# Patient Record
Sex: Male | Born: 1970 | Race: White | Hispanic: No | Marital: Married | Smoking: Former smoker
Health system: Southern US, Community
[De-identification: ages and names within clinical notes are randomized; demographics above are authoritative.]

## PROBLEM LIST (undated history)

## (undated) DIAGNOSIS — E785 Hyperlipidemia, unspecified: Secondary | ICD-10-CM

## (undated) DIAGNOSIS — I4891 Unspecified atrial fibrillation: Secondary | ICD-10-CM

## (undated) DIAGNOSIS — J45909 Unspecified asthma, uncomplicated: Secondary | ICD-10-CM

## (undated) HISTORY — PX: ANKLE SURGERY: SHX546

## (undated) HISTORY — DX: Unspecified atrial fibrillation: I48.91

---

## 1999-03-02 ENCOUNTER — Emergency Department (HOSPITAL_COMMUNITY): Admission: EM | Admit: 1999-03-02 | Discharge: 1999-03-02 | Payer: Self-pay | Admitting: Emergency Medicine

## 1999-03-02 ENCOUNTER — Encounter: Payer: Self-pay | Admitting: Emergency Medicine

## 2007-03-28 ENCOUNTER — Emergency Department (HOSPITAL_COMMUNITY): Admission: EM | Admit: 2007-03-28 | Discharge: 2007-03-28 | Payer: Self-pay | Admitting: Emergency Medicine

## 2011-01-11 LAB — TYPE AND SCREEN
ABO/RH(D): AB POS
Antibody Screen: NEGATIVE

## 2011-01-11 LAB — I-STAT 8, (EC8 V) (CONVERTED LAB)
Acid-Base Excess: 1
BUN: 10
Bicarbonate: 21.9
Chloride: 106
Glucose, Bld: 78
HCT: 48
Hemoglobin: 16.3
Operator id: 294521
Potassium: 3.9
Sodium: 138
TCO2: 23
pCO2, Ven: 25.3 — ABNORMAL LOW
pH, Ven: 7.545 — ABNORMAL HIGH

## 2011-01-11 LAB — ABO/RH: ABO/RH(D): AB POS

## 2011-01-11 LAB — POCT I-STAT CREATININE
Creatinine, Ser: 1.2
Operator id: 294521

## 2014-04-22 ENCOUNTER — Encounter (HOSPITAL_COMMUNITY): Payer: Self-pay | Admitting: Emergency Medicine

## 2014-04-22 ENCOUNTER — Emergency Department (HOSPITAL_COMMUNITY)
Admission: EM | Admit: 2014-04-22 | Discharge: 2014-04-22 | Disposition: A | Discharge status: Home / Self Care | Payer: BC Managed Care – PPO | Attending: Emergency Medicine | Admitting: Emergency Medicine

## 2014-04-22 ENCOUNTER — Emergency Department (HOSPITAL_COMMUNITY): Payer: BC Managed Care – PPO

## 2014-04-22 DIAGNOSIS — R0602 Shortness of breath: Secondary | ICD-10-CM | POA: Insufficient documentation

## 2014-04-22 DIAGNOSIS — J45909 Unspecified asthma, uncomplicated: Secondary | ICD-10-CM | POA: Diagnosis not present

## 2014-04-22 DIAGNOSIS — I4891 Unspecified atrial fibrillation: Secondary | ICD-10-CM

## 2014-04-22 DIAGNOSIS — Z720 Tobacco use: Secondary | ICD-10-CM | POA: Diagnosis not present

## 2014-04-22 DIAGNOSIS — I480 Paroxysmal atrial fibrillation: Secondary | ICD-10-CM

## 2014-04-22 HISTORY — DX: Unspecified asthma, uncomplicated: J45.909

## 2014-04-22 HISTORY — DX: Hyperlipidemia, unspecified: E78.5

## 2014-04-22 LAB — BASIC METABOLIC PANEL
Anion gap: 11 (ref 5–15)
BUN: 10 mg/dL (ref 6–23)
CHLORIDE: 107 meq/L (ref 96–112)
CO2: 21 mmol/L (ref 19–32)
CREATININE: 1.19 mg/dL (ref 0.50–1.35)
Calcium: 9.5 mg/dL (ref 8.4–10.5)
GFR calc Af Amer: 85 mL/min — ABNORMAL LOW (ref >90)
GFR calc non Af Amer: 73 mL/min — ABNORMAL LOW (ref >90)
GLUCOSE: 110 mg/dL — AB (ref 70–99)
POTASSIUM: 3.4 mmol/L — AB (ref 3.5–5.1)
SODIUM: 139 mmol/L (ref 135–145)

## 2014-04-22 LAB — MAGNESIUM: MAGNESIUM: 2 mg/dL (ref 1.5–2.5)

## 2014-04-22 LAB — CBC WITH DIFFERENTIAL/PLATELET
BASOS ABS: 0 10*3/uL (ref 0.0–0.1)
Basophils Relative: 0 % (ref 0–1)
EOS PCT: 0 % (ref 0–5)
Eosinophils Absolute: 0 10*3/uL (ref 0.0–0.7)
HEMATOCRIT: 44.8 % (ref 39.0–52.0)
Hemoglobin: 16 g/dL (ref 13.0–17.0)
LYMPHS PCT: 23 % (ref 12–46)
Lymphs Abs: 1.9 10*3/uL (ref 0.7–4.0)
MCH: 30.7 pg (ref 26.0–34.0)
MCHC: 35.7 g/dL (ref 30.0–36.0)
MCV: 86 fL (ref 78.0–100.0)
MONO ABS: 0.7 10*3/uL (ref 0.1–1.0)
Monocytes Relative: 9 % (ref 3–12)
NEUTROS ABS: 5.5 10*3/uL (ref 1.7–7.7)
Neutrophils Relative %: 68 % (ref 43–77)
PLATELETS: 189 10*3/uL (ref 150–400)
RBC: 5.21 MIL/uL (ref 4.22–5.81)
RDW: 12.9 % (ref 11.5–15.5)
WBC: 8.1 10*3/uL (ref 4.0–10.5)

## 2014-04-22 LAB — RAPID URINE DRUG SCREEN, HOSP PERFORMED
AMPHETAMINES: NOT DETECTED
BARBITURATES: NOT DETECTED
Benzodiazepines: NOT DETECTED
COCAINE: NOT DETECTED
OPIATES: NOT DETECTED
Tetrahydrocannabinol: NOT DETECTED

## 2014-04-22 LAB — TSH: TSH: 0.992 u[IU]/mL (ref 0.350–4.500)

## 2014-04-22 LAB — I-STAT TROPONIN, ED: TROPONIN I, POC: 0.01 ng/mL (ref 0.00–0.08)

## 2014-04-22 LAB — ETHANOL

## 2014-04-22 LAB — BRAIN NATRIURETIC PEPTIDE: B Natriuretic Peptide: 11 pg/mL (ref 0.0–100.0)

## 2014-04-22 MED ORDER — DILTIAZEM HCL 30 MG PO TABS
30.0000 mg | ORAL_TABLET | Freq: Once | ORAL | Status: AC
  Administered 2014-04-22: 30 mg via ORAL
  Filled 2014-04-22: qty 1

## 2014-04-22 MED ORDER — ASPIRIN EC 325 MG PO TBEC: 325.0000 mg | DELAYED_RELEASE_TABLET | Freq: Every day | ORAL | Status: DC

## 2014-04-22 MED ORDER — SODIUM CHLORIDE 0.9 % IV BOLUS (SEPSIS)
1000.0000 mL | Freq: Once | INTRAVENOUS | Status: AC
  Administered 2014-04-22: 1000 mL via INTRAVENOUS

## 2014-04-22 MED ORDER — FLECAINIDE ACETATE 100 MG PO TABS
300.0000 mg | ORAL_TABLET | Freq: Once | ORAL | Status: AC
  Administered 2014-04-22: 300 mg via ORAL
  Filled 2014-04-22 (×2): qty 3

## 2014-04-22 MED ORDER — POTASSIUM CHLORIDE CRYS ER 20 MEQ PO TBCR
40.0000 meq | EXTENDED_RELEASE_TABLET | Freq: Once | ORAL | Status: AC
  Administered 2014-04-22: 40 meq via ORAL
  Filled 2014-04-22: qty 2

## 2014-04-22 MED ORDER — DILTIAZEM HCL 100 MG IV SOLR
5.0000 mg/h | Freq: Once | INTRAVENOUS | Status: AC
  Administered 2014-04-22: 5 mg/h via INTRAVENOUS

## 2014-04-22 MED ORDER — MAGNESIUM SULFATE 2 GM/50ML IV SOLN
2.0000 g | Freq: Once | INTRAVENOUS | Status: AC
  Administered 2014-04-22: 2 g via INTRAVENOUS
  Filled 2014-04-22: qty 50

## 2014-04-22 MED ORDER — METOPROLOL SUCCINATE ER 25 MG PO TB24: 25.0000 mg | ORAL_TABLET | Freq: Every day | ORAL | Status: DC

## 2014-04-22 MED ORDER — ASPIRIN 81 MG PO CHEW
324.0000 mg | CHEWABLE_TABLET | Freq: Once | ORAL | Status: AC
  Administered 2014-04-22: 324 mg via ORAL
  Filled 2014-04-22: qty 4

## 2014-04-22 NOTE — Consult Note (Signed)
Primary cardiologist: New  HPI: 44 year old male with past medical history of asthma and hyperlipidemia for evaluation of atrial fibrillation. No prior cardiac history. He does not have dyspnea on exertion, orthopnea, PND, pedal edema, chest pain. He has a history of syncope when having his blood drawn. He has had occasional palpitations for years described as a brief flutter but not sustained. At approximately 4 AM today he awoke and went to the bathroom. Upon returning he developed sudden onset of palpitations described as his heart racing and fluttering. He had some dyspnea and dizziness but no syncope or chest pain. His symptoms continued and he presented to the emergency room and was found to be in atrial fibrillation. His heart rate has improved with Cardizem. Cardiology asked to evaluate. There is no history of diabetes mellitus, hypertension, congestive heart failure, prior stroke.   (Not in a hospital admission)  No Known Allergies   Past Medical History  Diagnosis Date  . Asthma   . Hyperlipidemia     Past Surgical History  Procedure Laterality Date  . Ankle surgery      History   Social History  . Marital Status: Married    Spouse Name: N/A    Number of Children: 2  . 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 Smoking status: Current Every Day Smoker    Types: Cigarettes  . Smokeless tobacco: Not on file  . Alcohol Use: No  . Drug Use: No  . Sexual Activity: Not on file   Other Topics Concern  . Not on file   Social History Narrative  . No narrative on file    Family History  Problem Relation Age of Onset  . Heart disease      No family history    ROS:  no fevers or chills, productive cough, hemoptysis, dysphasia, odynophagia, melena, hematochezia, dysuria, hematuria, rash, seizure activity, orthopnea, PND, pedal edema, claudication. Remaining systems are negative.  Physical Exam:   Blood  pressure 106/68, pulse 69, temperature 100.1 F (37.8 C), temperature source Oral, resp. rate 12, height '5\' 11"'  (1.803 m), weight 160 lb (72.576 kg), SpO2 97 %.  General:  Well developed/well nourished in NAD Skin warm/dry Patient not depressed No peripheral clubbing Back-normal HEENT-normal/normal eyelids Neck supple/normal carotid upstroke bilaterally; no bruits; no JVD; no thyromegaly chest - CTA/ normal expansion CV - irregular/normal S1 and S2; no murmurs, rubs or gallops;  PMI nondisplaced Abdomen -NT/ND, no HSM, no mass, + bowel sounds, no bruit 2+ femoral pulses, no bruits Ext-no edema, chords, 2+ DP Neuro-grossly nonfocal  ECG atrial fibrillation, no ST changes.   Results for orders placed or performed during the hospital encounter of 04/22/14 (from the past 48 hour(s))  Brain natriuretic peptide (only with dyspnea)     Status: None   Collection Time: 04/22/14  6:08 AM  Result Value Ref Range   B Natriuretic Peptide 11.0 0.0 - 100.0 pg/mL    Comment: Please note change in reference range.  CBC with Differential     Status: None   Collection Time: 04/22/14  6:08 AM  Result Value Ref Range   WBC 8.1 4.0 - 10.5 K/uL   RBC 5.21 4.22 - 5.81 MIL/uL   Hemoglobin 16.0 13.0 - 17.0 g/dL   HCT 44.8 39.0 - 52.0 %   MCV 86.0 78.0 - 100.0 fL   MCH 30.7 26.0 - 34.0 pg  MCHC 35.7 30.0 - 36.0 g/dL   RDW 12.9 11.5 - 15.5 %   Platelets 189 150 - 400 K/uL   Neutrophils Relative % 68 43 - 77 %   Neutro Abs 5.5 1.7 - 7.7 K/uL   Lymphocytes Relative 23 12 - 46 %   Lymphs Abs 1.9 0.7 - 4.0 K/uL   Monocytes Relative 9 3 - 12 %   Monocytes Absolute 0.7 0.1 - 1.0 K/uL   Eosinophils Relative 0 0 - 5 %   Eosinophils Absolute 0.0 0.0 - 0.7 K/uL   Basophils Relative 0 0 - 1 %   Basophils Absolute 0.0 0.0 - 0.1 K/uL  Basic metabolic panel     Status: Abnormal   Collection Time: 04/22/14  6:08 AM  Result Value Ref Range   Sodium 139 135 - 145 mmol/L    Comment: Please note change in  reference range.   Potassium 3.4 (L) 3.5 - 5.1 mmol/L    Comment: Please note change in reference range.   Chloride 107 96 - 112 mEq/L   CO2 21 19 - 32 mmol/L   Glucose, Bld 110 (H) 70 - 99 mg/dL   BUN 10 6 - 23 mg/dL   Creatinine, Ser 1.19 0.50 - 1.35 mg/dL   Calcium 9.5 8.4 - 10.5 mg/dL   GFR calc non Af Amer 73 (L) >90 mL/min   GFR calc Af Amer 85 (L) >90 mL/min    Comment: (NOTE) The eGFR has been calculated using the CKD EPI equation. This calculation has not been validated in all clinical situations. eGFR's persistently <90 mL/min signify possible Chronic Kidney Disease.    Anion gap 11 5 - 15  Magnesium     Status: None   Collection Time: 04/22/14  6:08 AM  Result Value Ref Range   Magnesium 2.0 1.5 - 2.5 mg/dL  Ethanol     Status: None   Collection Time: 04/22/14  6:08 AM  Result Value Ref Range   Alcohol, Ethyl (B) <5 0 - 9 mg/dL    Comment:        LOWEST DETECTABLE LIMIT FOR SERUM ALCOHOL IS 11 mg/dL FOR MEDICAL PURPOSES ONLY   Urine rapid drug screen (hosp performed)     Status: None   Collection Time: 04/22/14  6:14 AM  Result Value Ref Range   Opiates NONE DETECTED NONE DETECTED   Cocaine NONE DETECTED NONE DETECTED   Benzodiazepines NONE DETECTED NONE DETECTED   Amphetamines NONE DETECTED NONE DETECTED   Tetrahydrocannabinol NONE DETECTED NONE DETECTED   Barbiturates NONE DETECTED NONE DETECTED    Comment:        DRUG SCREEN FOR MEDICAL PURPOSES ONLY.  IF CONFIRMATION IS NEEDED FOR ANY PURPOSE, NOTIFY LAB WITHIN 5 DAYS.        LOWEST DETECTABLE LIMITS FOR URINE DRUG SCREEN Drug Class       Cutoff (ng/mL) Amphetamine      1000 Barbiturate      200 Benzodiazepine   109 Tricyclics       323 Opiates          300 Cocaine          300 THC              50   I-stat troponin, ED (not at [facility])     Status: None   Collection Time: 04/22/14  6:22 AM  Result Value Ref Range   Troponin i, poc 0.01 0.00 - 0.08 ng/mL   Comment 3  Comment: Due to the  release kinetics of cTnI, a negative result within the first hours of the onset of symptoms does not rule out myocardial infarction with certainty. If myocardial infarction is still suspected, repeat the test at appropriate intervals.     Dg Chest Port 1 View  04/22/2014   CLINICAL DATA:  Acute onset of shortness of breath. Atrial fibrillation. Initial encounter.  EXAM: PORTABLE CHEST - 1 VIEW  COMPARISON:  None.  FINDINGS: The lungs are well-aerated and clear. There is no evidence of focal opacification, pleural effusion or pneumothorax.  The cardiomediastinal silhouette is within normal limits. No acute osseous abnormalities are seen.  IMPRESSION: No acute cardiopulmonary process seen.   Electronically Signed   By: Garald Balding M.D.   On: 04/22/2014 06:26    Assessment/Plan 1 new-onset atrial fibrillation-the patient presents with palpitations that started at 4 AM today. His electrocardiogram is consistent with atrial fibrillation. He has no embolic risk factors including no diabetes mellitus, hypertension, prior stroke, coronary artery disease or congestive heart failure. His CHADSvasc -0. Given that his onset was less than 24 hours ago we will try to reestablish sinus rhythm. We will give flecainide 300 mg by mouth 1. Check TSH and echocardiogram. If LV function normal and he converts to sinus rhythm he can be discharged from a cardiac standpoint later today. At discharge I would treat with Toprol 25 mg daily and aspirin 325 mg daily. He will need follow-up with me. 2 tobacco abuse-patient counseled on discontinuing. 3 hypokalemia-supplement. For asthma-management per primary care.  Kirk Ruths MD 04/22/2014, 10:42 AM

## 2014-04-22 NOTE — ED Notes (Signed)
Ordered heart healthy breakfast tray for pt 

## 2014-04-22 NOTE — ED Provider Notes (Signed)
CSN: [id number]     Arrival date & time 04/22/14  0556 History   First MD Initiated Contact with Patient 04/22/14 0606     Chief Complaint  Patient presents with  . Atrial Fibrillation  . Shortness of Breath     (Consider location/radiation/quality/duration/timing/severity/associated sxs/prior Treatment) HPI  George Cordova is a 44 y.o. male with past medical history of asthma presenting today with palpitations and shortness of breath. Patient woke up at 445 this morning with the symptoms. He states he has had this before over the past couple years but his primary physician as they're available to catch it. He normally goes away within a few seconds however this time it persisted. EMS gave him a 20 mg bolus of diltiazem, followed by additional 10 mg bolus, followed by continuous drip at 5 mg per hour. Patient states he is symptomatically improved. He denies any risk factors for pulmonary embolism or history thereof. Patient has had tremendous anxiety and stress recently, he has also had a poor appetite. He denies any recent infections. Patient has no further complaints.  10 Systems reviewed and are negative for acute change except as noted in the HPI.     Past Medical History  Diagnosis Date  . Asthma    History reviewed. No pertinent past surgical history. No family history on file. History  Substance Use Topics  . Smoking status: Current Every Day Smoker    Types: Cigarettes  . Smokeless tobacco: Not on file  . Alcohol Use: No    Review of Systems    Allergies  Review of patient's allergies indicates no known allergies.  Home Medications   Prior to Admission medications   Not on File   BP 124/74 mmHg  Pulse 96  Temp(Src) 100.1 F (37.8 C) (Oral)  Resp 13  Ht  (1.803 m)  Wt 160 lb (72.576 kg)  BMI 22.33 kg/m2  SpO2 99% Physical Exam  Constitutional: He is oriented to person, place, and time. Vital signs are normal. He appears well-developed and  well-nourished.  Non-toxic appearance. He does not appear ill. No distress.  HENT:  Head: Normocephalic and atraumatic.  Nose: Nose normal.  Mouth/Throat: Oropharynx is clear and moist. No oropharyngeal exudate.  Eyes: Conjunctivae and EOM are normal. Pupils are equal, round, and reactive to light. No scleral icterus.  Neck: Normal range of motion. Neck supple. No tracheal deviation, no edema, no erythema and normal range of motion present. No thyroid mass and no thyromegaly present.  Cardiovascular: S1 normal, S2 normal, normal heart sounds, intact distal pulses and normal pulses.  Exam reveals no gallop and no friction rub.   No murmur heard. Pulses:      Radial pulses are 2+ on the right side, and 2+ on the left side.       Dorsalis pedis pulses are 2+ on the right side, and 2+ on the left side.  Irregularly irregular rhythm. Tachycardia.  Pulmonary/Chest: Effort normal and breath sounds normal. No respiratory distress. He has no wheezes. He has no rhonchi. He has no rales.  Abdominal: Soft. Normal appearance and bowel sounds are normal. He exhibits no distension, no ascites and no mass. There is no hepatosplenomegaly. There is no tenderness. There is no rebound, no guarding and no CVA tenderness.  Musculoskeletal: Normal range of motion. He exhibits no edema or tenderness.  Lymphadenopathy:    He has no cervical adenopathy.  Neurological: He is alert and oriented to person, place, and time. He has  normal strength. No cranial nerve deficit or sensory deficit. He exhibits normal muscle tone. GCS eye subscore is 4. GCS verbal subscore is 5. GCS motor subscore is 6.  Skin: Skin is warm, dry and intact. No petechiae and no rash noted. He is not diaphoretic. No erythema. No pallor.  Psychiatric: He has a normal mood and affect. His behavior is normal. Judgment normal.  Nursing note and vitals reviewed.   ED Course  Procedures (including critical care time) Labs Review Labs Reviewed  BASIC  METABOLIC PANEL - Abnormal; Notable for the following:    Potassium 3.4 (*)    Glucose, Bld 110 (*)    GFR calc non Af Amer 73 (*)    GFR calc Af Amer 85 (*)    All other components within normal limits  BRAIN NATRIURETIC PEPTIDE  CBC WITH DIFFERENTIAL  URINE RAPID DRUG SCREEN (HOSP PERFORMED)  MAGNESIUM  ETHANOL  TSH  I-STAT TROPOININ, ED    Imaging Review Dg Chest Port 1 View  04/22/2014   CLINICAL DATA:  Acute onset of shortness of breath. Atrial fibrillation. Initial encounter.  EXAM: PORTABLE CHEST - 1 VIEW  COMPARISON:  None.  FINDINGS: The lungs are well-aerated and clear. There is no evidence of focal opacification, pleural effusion or pneumothorax.  The cardiomediastinal silhouette is within normal limits. No acute osseous abnormalities are seen.  IMPRESSION: No acute cardiopulmonary process seen.   Electronically Signed   By: Roanna RaiderJeffery  Chang M.D.   On: 04/22/2014 06:26     EKG Interpretation   Date/Time:  Friday April 22 2014 06:03:32 EST Ventricular Rate:  115 PR Interval:    QRS Duration: 96 QT Interval:  333 QTC Calculation: 461 R Axis:   75 Text Interpretation:  Atrial fibrillation RSR' in V1 or V2, right VCD or  RVH Confirmed by Erroll Lunani, Rumi Taras Ayokunle [id number]) on 04/22/2014 6:12:08 AM      MDM   Final diagnoses:  None    Patient presents emergency department for palpitations and shortness of breath. EKG does reveal atrial fibrillation. On diltiazem drip patient's heart rate maintained between 100-110.At times, his HR does rise to 130s.  Patient will need admission to cardiology or hospitalist for continued management.  EDevaluation does not reveal a cause for his new onset a fib.  CHADS2VASC is zero.  Emergency department workup for etiology of atrial fibrillation is negative. Patient will be retained in the hospital for continued management.  CRITICAL CARE Performed by: Tomasita CrumbleNI,Kiko Ripp   Total critical care time: 40min  Critical care time was exclusive  of separately billable procedures and treating other patients.  Critical care was necessary to treat or prevent imminent or life-threatening deterioration.  Critical care was time spent personally by me on the following activities: development of treatment plan with patient and/or surrogate as well as nursing, discussions with consultants, evaluation of patient's response to treatment, examination of patient, obtaining history from patient or surrogate, ordering and performing treatments and interventions, ordering and review of laboratory studies, ordering and review of radiographic studies, pulse oximetry and re-evaluation of patient's condition.   Tomasita CrumbleAdeleke Ashelyn Mccravy, MD 04/22/14 1524

## 2014-04-22 NOTE — ED Notes (Signed)
Spoke with Dr. Rhunette CroftNanavati about cardizem drip. Instructed to stop drip at this time. Will continue to monitor HR.

## 2014-04-22 NOTE — ED Notes (Signed)
Went to check on patient. Noted to be in SR. HR 68. Pt reports he just started feeling better. EKG obtained.

## 2014-04-22 NOTE — Progress Notes (Signed)
  Echocardiogram 2D Echocardiogram has been performed.  George Cordova FRANCES 04/22/2014, 4:06 PM

## 2014-04-22 NOTE — ED Notes (Signed)
Contacted ECHO. Pt. Next on list to transport over to department.

## 2014-04-22 NOTE — ED Notes (Signed)
Pt reports he woke up from his sleep at 0445 feeling very SOB and that his heart was racing. Pt reports he has felt this sensation before but has never been seen for it as it would go away. EMS reports Afib RVR with a rate of 150-200 upon initial arrival. EMS adm a 20 mg bolus of cardizem, followed up with an additional 10 mg bolus of cardizem, with a continuous rate of 5mg /hr. Pt reports decreased SOB since administration of cardizem. Pt denies chest pain. A&O X4.

## 2014-04-22 NOTE — ED Notes (Signed)
MD at bedside. 

## 2014-04-22 NOTE — ED Provider Notes (Signed)
  Physical Exam  BP 119/76 mmHg  Pulse 83  Temp(Src) 100.1 F (37.8 C) (Oral)  Resp 17  Ht 5\' 11"  (1.803 m)  Wt 160 lb (72.576 kg)  BMI 22.33 kg/m2  SpO2 99%  Physical Exam  ED Course  Procedures  MDM  Pt came in with afib with RVR. He was started on diltiazem drip. Rate controlled over here. WE switched pt to oral dilt. Cards consulted. Pt received Flecainide, and had rest of his Cards workup done - all neg. Pt was discharged with toprol. He has converted to sinus. Return precautions discussed.  CRITICAL CARE Performed by: Derwood KaplanNanavati, Antonae Zbikowski   Total critical care time: 45 min  Critical care time was exclusive of separately billable procedures and treating other patients.  Critical care was necessary to treat or prevent imminent or life-threatening deterioration.  Critical care was time spent personally by me on the following activities: development of treatment plan with patient and/or surrogate as well as nursing, discussions with consultants, evaluation of patient's response to treatment, examination of patient, obtaining history from patient or surrogate, ordering and performing treatments and interventions, ordering and review of laboratory studies, ordering and review of radiographic studies, pulse oximetry and re-evaluation of patient's condition.    EKG Interpretation  Date/Time:  Friday April 22 2014 11:51:03 EST Ventricular Rate:  68 PR Interval:  173 QRS Duration: 94 QT Interval:  381 QTC Calculation: 405 R Axis:   68 Text Interpretation:  Sinus rhythm RSR' in V1 or V2, probably normal variant Converted to sinus, post flecainide Confirmed by Rhunette CroftNANAVATI, MD, Janey GentaANKIT [id number]) on 04/22/2014 12:09:59 PM        Derwood KaplanAnkit Tyde Lamison, MD 04/22/14 1700

## 2014-04-22 NOTE — Discharge Instructions (Signed)
You were noted to have atrial fibrillation. This is a new diagnosis. Cardiology saw you - and you had a full workkup whilst in the ER.  We would like you to be started on Toprol and Aspirin - everyday as prescribed. Return to the ER if the symptoms get worse.   See Cardiology doctor in 2 weeks.   Atrial Fibrillation Atrial fibrillation is a type of irregular heart rhythm (arrhythmia). During atrial fibrillation, the upper chambers of the heart (atria) quiver continuously in a chaotic pattern. This causes an irregular and often rapid heart rate.  Atrial fibrillation is the result of the heart becoming overloaded with disorganized signals that tell it to beat. These signals are normally released one at a time by a part of the right atrium called the sinoatrial node. They then travel from the atria to the lower chambers of the heart (ventricles), causing the atria and ventricles to contract and pump blood as they pass. In atrial fibrillation, parts of the atria outside of the sinoatrial node also release these signals. This results in two problems. First, the atria receive so many signals that they do not have time to fully contract. Second, the ventricles, which can only receive one signal at a time, beat irregularly and out of rhythm with the atria.  There are three types of atrial fibrillation:   Paroxysmal. Paroxysmal atrial fibrillation starts suddenly and stops on its own within a week.  Persistent. Persistent atrial fibrillation lasts for more than a week. It may stop on its own or with treatment.  Permanent. Permanent atrial fibrillation does not go away. Episodes of atrial fibrillation may lead to permanent atrial fibrillation. Atrial fibrillation can prevent your heart from pumping blood normally. It increases your risk of stroke and can lead to heart failure.  CAUSES   Heart conditions, including a heart attack, heart failure, coronary artery disease, and heart valve conditions.    Inflammation of the sac that surrounds the heart (pericarditis).  Blockage of an artery in the lungs (pulmonary embolism).  Pneumonia or other infections.  Chronic lung disease.  Thyroid problems, especially if the thyroid is overactive (hyperthyroidism).  Caffeine, excessive alcohol use, and use of some illegal drugs.   Use of some medicines, including certain decongestants and diet pills.  Heart surgery.   Birth defects.  Sometimes, no cause can be found. When this happens, the atrial fibrillation is called lone atrial fibrillation. The risk of complications from atrial fibrillation increases if you have lone atrial fibrillation and you are age 5 years or older. RISK FACTORS  Heart failure.  Coronary artery disease.  Diabetes mellitus.   High blood pressure (hypertension).   Obesity.   Other arrhythmias.   Increased age. SIGNS AND SYMPTOMS   A feeling that your heart is beating rapidly or irregularly.   A feeling of discomfort or pain in your chest.   Shortness of breath.   Sudden light-headedness or weakness.   Getting tired easily when exercising.   Urinating more often than normal (mainly when atrial fibrillation first begins).  In paroxysmal atrial fibrillation, symptoms may start and suddenly stop. DIAGNOSIS  Your health care provider may be able to detect atrial fibrillation when taking your pulse. Your health care provider may have you take a test called an ambulatory electrocardiogram (ECG). An ECG records your heartbeat patterns over a 24-hour period. You may also have other tests, such as:  Transthoracic echocardiogram (TTE). During echocardiography, sound waves are used to evaluate how blood flows through  your heart.  Transesophageal echocardiogram (TEE).  Stress test. There is more than one type of stress test. If a stress test is needed, ask your health care provider about which type is best for you.  Chest X-ray exam.  Blood  tests.  Computed tomography (CT). TREATMENT  Treatment may include:  Treating any underlying conditions. For example, if you have an overactive thyroid, treating the condition may correct atrial fibrillation.  Taking medicine. Medicines may be given to control a rapid heart rate or to prevent blood clots, heart failure, or a stroke.  Having a procedure to correct the rhythm of the heart:  Electrical cardioversion. During electrical cardioversion, a controlled, low-energy shock is delivered to the heart through your skin. If you have chest pain, very low blood pressure, or sudden heart failure, this procedure may need to be done as an emergency.  Catheter ablation. During this procedure, heart tissues that send the signals that cause atrial fibrillation are destroyed.  Surgical ablation. During this surgery, thin lines of heart tissue that carry the abnormal signals are destroyed. This procedure can either be an open-heart surgery or a minimally invasive surgery. With the minimally invasive surgery, small cuts are made to access the heart instead of a large opening.  Pulmonary venous isolation. During this surgery, tissue around the veins that carry blood from the lungs (pulmonary veins) is destroyed. This tissue is thought to carry the abnormal signals. HOME CARE INSTRUCTIONS   Take medicines only as directed by your health care provider. Some medicines can make atrial fibrillation worse or recur.  If blood thinners were prescribed by your health care provider, take them exactly as directed. Too much blood-thinning medicine can cause bleeding. If you take too little, you will not have the needed protection against stroke and other problems.  Perform blood tests at home if directed by your health care provider. Perform blood tests exactly as directed.  Quit smoking if you smoke.  Do not drink alcohol.  Do not drink caffeinated beverages such as coffee, soda, and some teas. You may drink  decaffeinated coffee, soda, or tea.   Maintain a healthy weight.Do not use diet pills unless your health care provider approves. They may make heart problems worse.   Follow diet instructions as directed by your health care provider.  Exercise regularly as directed by your health care provider.  Keep all follow-up visits as directed by your health care provider. This is important. PREVENTION  The following substances can cause atrial fibrillation to recur:   Caffeinated beverages.  Alcohol.  Certain medicines, especially those used for breathing problems.  Certain herbs and herbal medicines, such as those containing ephedra or ginseng.  Illegal drugs, such as cocaine and amphetamines. Sometimes medicines are given to prevent atrial fibrillation from recurring. Proper treatment of any underlying condition is also important in helping prevent recurrence.  SEEK MEDICAL CARE IF:  You notice a change in the rate, rhythm, or strength of your heartbeat.  You suddenly begin urinating more frequently.  You tire more easily when exerting yourself or exercising. SEEK IMMEDIATE MEDICAL CARE IF:   You have chest pain, abdominal pain, sweating, or weakness.  You feel nauseous.  You have shortness of breath.  You suddenly have swollen feet and ankles.  You feel dizzy.  Your face or limbs feel numb or weak.  You have a change in your vision or speech. MAKE SURE YOU:   Understand these instructions.  Will watch your condition.  Will get help  right away if you are not doing well or get worse. Document Released: 03/25/2005 Document Revised: 08/09/2013 Document Reviewed: 05/05/2012 [facility] Patient Information 2015 [place]. This information is not intended to replace advice given to you by your health care provider. Make sure you discuss any questions you have with your health care provider.

## 2014-05-25 ENCOUNTER — Telehealth: Payer: Self-pay | Admitting: *Deleted

## 2014-05-25 ENCOUNTER — Telehealth: Payer: Self-pay | Admitting: Cardiology

## 2014-05-25 MED ORDER — METOPROLOL SUCCINATE ER 25 MG PO TB24: 25.0000 mg | ORAL_TABLET | Freq: Every day | ORAL | Status: DC

## 2014-05-25 NOTE — Telephone Encounter (Signed)
Refill submitted to patient's preferred pharmacy. Informed patient. Pt voiced understanding, no other stated concerns at this time.  

## 2014-05-25 NOTE — Telephone Encounter (Signed)
°  1. Which medications need to be refilled? Toprolol--needs a new prescription sent   2. Which pharmacy is medication to be sent to?[facility] [address]  3. Do they need a 30 day or 90 day supply? 90  4. Would they like a call back once the medication has been sent to the pharmacy? yes

## 2014-05-25 NOTE — Progress Notes (Signed)
      HPI: 44 year old male for follow-up of atrial fibrillation. Seen in the emergency room in January 2016 with new onset atrial fibrillation. Converted to sinus rhythm with flecainide. Echocardiogram January 2016 showed normal LV function. TSH, hemoglobin and renal function normal. Potassium 3.4. Since last seen,  Current Outpatient Prescriptions  Medication Sig Dispense Refill  . aspirin EC 325 MG tablet Take 1 tablet (325 mg total) by mouth daily. 30 tablet 0  . Fish Oil-Cholecalciferol (FISH OIL + D3 PO) Take 1 capsule by mouth daily.    . metoprolol succinate (TOPROL-XL) 25 MG 24 hr tablet Take 1 tablet (25 mg total) by mouth daily. 30 tablet 0  . VITAMIN D, ERGOCALCIFEROL, PO Take 1 capsule by mouth daily.     No current facility-administered medications for this visit.     Past Medical History  Diagnosis Date  . Asthma   . Hyperlipidemia     Past Surgical History  Procedure Laterality Date  . Ankle surgery      History   Social History  . Marital Status: Married    Spouse Name: N/A  . Number of Children: 2  . 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 Smoking status: Current Every Day Smoker    Types: Cigarettes  . Smokeless tobacco: Not on file  . Alcohol Use: No  . Drug Use: No  . Sexual Activity: Not on file   Other Topics Concern  . Not on file   Social History Narrative  . No narrative on file    ROS: no fevers or chills, productive cough, hemoptysis, dysphasia, odynophagia, melena, hematochezia, dysuria, hematuria, rash, seizure activity, orthopnea, PND, pedal edema, claudication. Remaining systems are negative.  Physical Exam: Well-developed well-nourished in no acute distress.  Skin is warm and dry.  HEENT is normal.  Neck is supple.  Chest is clear to auscultation with normal expansion.  Cardiovascular exam is regular rate and rhythm.  Abdominal exam nontender or distended. No masses  palpated. Extremities show no edema. neuro grossly intact  ECG     This encounter was created in error - please disregard.

## 2014-05-26 NOTE — Telephone Encounter (Signed)
Med refilled.

## 2014-05-27 ENCOUNTER — Encounter: Payer: BC Managed Care – PPO | Admitting: Cardiology

## 2014-06-18 NOTE — Progress Notes (Signed)
      HPI: FU atrial fibrillation. Seen in ER 1/16 with atrial fibrillation. Patient converted to sinus with flecanide 300 mg po x 1. Echo 1/16 showed normal LV function. TSH, renal function and hemoglobin normal. Since then, the patient denies any dyspnea on exertion, orthopnea, PND, pedal edema, palpitations, syncope or chest pain.   Current Outpatient Prescriptions  Medication Sig Dispense Refill  . aspirin EC 325 MG tablet Take 1 tablet (325 mg total) by mouth daily. 30 tablet 0  . Fish Oil-Cholecalciferol (FISH OIL + D3 PO) Take 1 capsule by mouth daily.    . metoprolol succinate (TOPROL-XL) 25 MG 24 hr tablet Take 1 tablet (25 mg total) by mouth daily. 90 tablet 0  . VITAMIN D, ERGOCALCIFEROL, PO Take 1 capsule by mouth daily.     No current facility-administered medications for this visit.     Past Medical History  Diagnosis Date  . Asthma   . Hyperlipidemia   . Atrial fibrillation     Past Surgical History  Procedure Laterality Date  . Ankle surgery      History   Social History  . Marital Status: Married    Spouse Name: N/A  . Number of Children: 2  . 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 Smoking status: Current Every Day Smoker    Types: Cigarettes  . Smokeless tobacco: Not on file  . Alcohol Use: No  . Drug Use: No  . Sexual Activity: Not on file   Other Topics Concern  . Not on file   Social History Narrative    ROS: no fevers or chills, productive cough, hemoptysis, dysphasia, odynophagia, melena, hematochezia, dysuria, hematuria, rash, seizure activity, orthopnea, PND, pedal edema, claudication. Remaining systems are negative.  Physical Exam: Well-developed well-nourished in no acute distress.  Skin is warm and dry.  HEENT is normal.  Neck is supple.  Chest is clear to auscultation with normal expansion.  Cardiovascular exam is regular rate and rhythm.  Abdominal exam nontender or  distended. No masses palpated. Extremities show no edema. neuro grossly intact  ECG  sinus rhythm at a rate of 78. RV conduction delay.

## 2014-06-21 ENCOUNTER — Encounter: Payer: Self-pay | Admitting: Cardiology

## 2014-06-21 ENCOUNTER — Ambulatory Visit (INDEPENDENT_AMBULATORY_CARE_PROVIDER_SITE_OTHER): Payer: BC Managed Care – PPO | Admitting: Cardiology

## 2014-06-21 VITALS — BP 126/64 | HR 78 | Ht 71.0 in | Wt 148.0 lb

## 2014-06-21 DIAGNOSIS — I480 Paroxysmal atrial fibrillation: Secondary | ICD-10-CM

## 2014-06-21 DIAGNOSIS — Z720 Tobacco use: Secondary | ICD-10-CM

## 2014-06-21 NOTE — Patient Instructions (Signed)
Your physician wants you to follow-up in: 6 MONTHS WITH DR CRENSHAW You will receive a reminder letter in the mail two months in advance. If you don't receive a letter, please call our office to schedule the follow-up appointment.  

## 2014-06-21 NOTE — Assessment & Plan Note (Signed)
Patient remains in sinus rhythm.continue Toprol. We will consider an antiarrhythmic such as flecainide in the future if he has more frequent episodes. His Chadsvasc is 0. No anticoagulation indicated.

## 2014-06-21 NOTE — Assessment & Plan Note (Signed)
Patient counseled on discontinuing. 

## 2014-08-29 ENCOUNTER — Other Ambulatory Visit: Payer: Self-pay | Admitting: *Deleted

## 2014-08-29 MED ORDER — METOPROLOL SUCCINATE ER 25 MG PO TB24: 25.0000 mg | ORAL_TABLET | Freq: Every day | ORAL | Status: DC

## 2014-11-28 ENCOUNTER — Other Ambulatory Visit: Payer: Self-pay

## 2014-11-28 ENCOUNTER — Telehealth: Payer: Self-pay | Admitting: Cardiology

## 2014-11-28 MED ORDER — METOPROLOL SUCCINATE ER 25 MG PO TB24: 25.0000 mg | ORAL_TABLET | Freq: Every day | ORAL | Status: DC

## 2014-11-28 NOTE — Telephone Encounter (Signed)
°  1. Which medications need to be refilled? Metoprolol  2. Which pharmacy is medication to [facility] sent to?[phone]  3. Do they need a 30 day or 90 day supply? 90 and refills  4. Would they like a call back once the medication has been sent to the pharmacy? yes

## 2015-10-29 IMAGING — CR DG CHEST 1V PORT
1 series · 1 of 1 positions shown · non-contrast
Comparison: None.

CLINICAL DATA: Acute onset of shortness of breath. Atrial
fibrillation. Initial encounter.

EXAM:
PORTABLE CHEST - 1 VIEW

[AP]
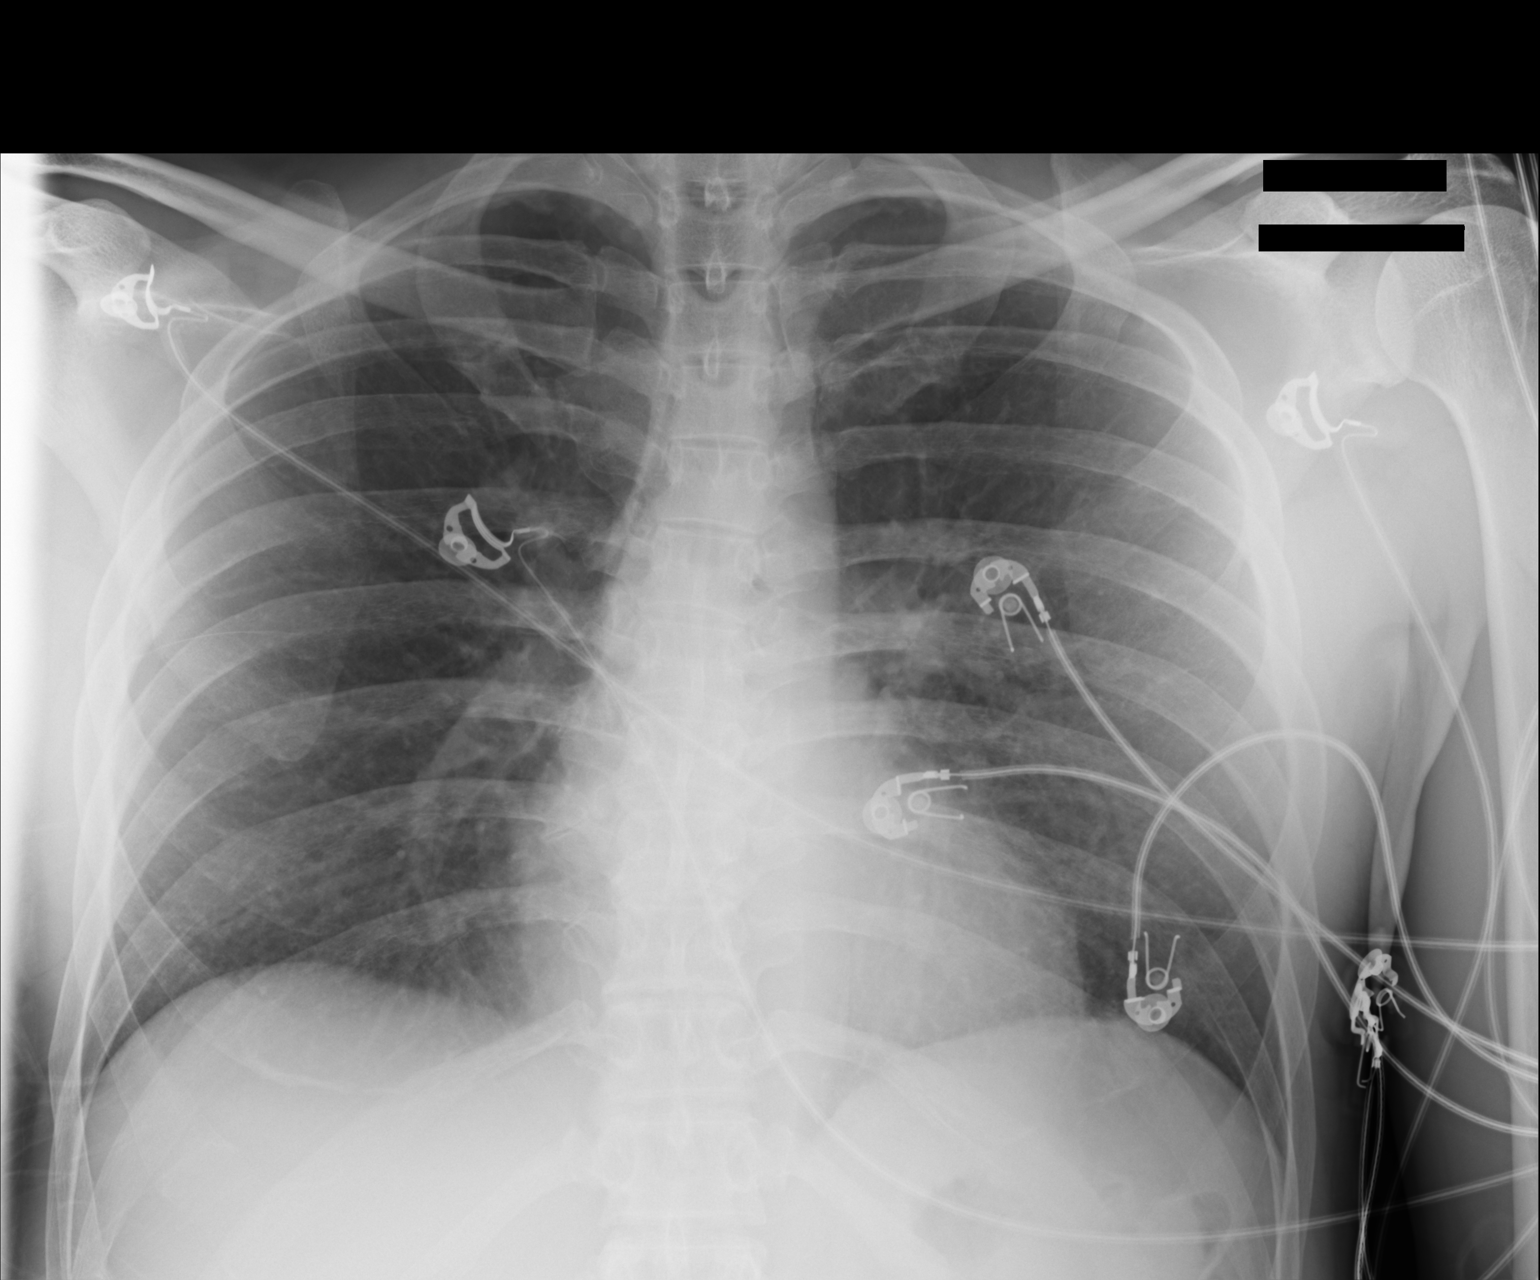

[1 of 1 positions shown; findings below may reference images not displayed]

FINDINGS: The lungs are well-aerated and clear. There is no evidence of focal
opacification, pleural effusion or pneumothorax.

The cardiomediastinal silhouette is within normal limits. No acute
osseous abnormalities are seen.
IMPRESSION: No acute cardiopulmonary process seen.

## 2015-11-10 ENCOUNTER — Telehealth: Payer: Self-pay | Admitting: *Deleted

## 2015-11-10 MED ORDER — METOPROLOL SUCCINATE ER 25 MG PO TB24: 25.0000 mg | ORAL_TABLET | Freq: Every day | ORAL | 0 refills | Status: DC

## 2015-11-10 NOTE — Telephone Encounter (Signed)
Patient calling to refill Metoprolol XL 25mg .

## 2016-02-08 ENCOUNTER — Other Ambulatory Visit: Payer: Self-pay | Admitting: *Deleted

## 2016-02-08 MED ORDER — METOPROLOL SUCCINATE ER 25 MG PO TB24: 25.0000 mg | ORAL_TABLET | Freq: Every day | ORAL | 0 refills | Status: DC

## 2016-02-09 ENCOUNTER — Telehealth: Payer: Self-pay | Admitting: Cardiology

## 2016-02-09 NOTE — Telephone Encounter (Signed)
Closed encounter °

## 2016-03-11 ENCOUNTER — Other Ambulatory Visit: Payer: Self-pay | Admitting: *Deleted

## 2016-03-11 MED ORDER — METOPROLOL SUCCINATE ER 25 MG PO TB24: 25.0000 mg | ORAL_TABLET | Freq: Every day | ORAL | 0 refills | Status: DC

## 2016-03-11 NOTE — Telephone Encounter (Signed)
Rx(s) sent to pharmacy electronically.  

## 2016-04-10 NOTE — Progress Notes (Signed)
      HPI: FU atrial fibrillation. Seen in ER 1/16 with atrial fibrillation. Patient converted to sinus with flecanide 300 mg po x 1. Echo 1/16 showed normal LV function. TSH, renal function and hemoglobin normal. Since last seen, the patient denies any dyspnea on exertion, orthopnea, PND, pedal edema, palpitations, syncope or chest pain.   Current Outpatient Prescriptions  Medication Sig Dispense Refill  . Ascorbic Acid (VITAMIN C PO) Take 1 tablet by mouth daily.    Marland Kitchen. aspirin EC 325 MG tablet Take 1 tablet (325 mg total) by mouth daily. 30 tablet 0  . Cholecalciferol (VITAMIN D PO) Take 1 tablet by mouth daily.    . Fish Oil-Cholecalciferol (FISH OIL + D3 PO) Take 1 capsule by mouth daily.    . metoprolol succinate (TOPROL-XL) 25 MG 24 hr tablet Take 1 tablet (25 mg total) by mouth daily. Must keep appointment 04/16/16 with Dr Jens Somrenshaw for future refills. 30 tablet 0  . VITAMIN D, ERGOCALCIFEROL, PO Take 1 capsule by mouth daily.     No current facility-administered medications for this visit.      Past Medical History:  Diagnosis Date  . Asthma   . Atrial fibrillation (HCC)   . Hyperlipidemia     Past Surgical History:  Procedure Laterality Date  . ANKLE SURGERY      Social History   Social History  . Marital status: Married    Spouse name: N/A  . Number of children: 2  . 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 Smoking status: Current Every Day Smoker    Types: Cigarettes  . Smokeless tobacco: Never Used  . Alcohol use No  . Drug use: No  . Sexual activity: Not on file   Other Topics Concern  . Not on file   Social History Narrative  . No narrative on file    Family History  Problem Relation Age of Onset  . Heart disease      No family history    ROS: no fevers or chills, productive cough, hemoptysis, dysphasia, odynophagia, melena, hematochezia, dysuria, hematuria, rash, seizure activity,  orthopnea, PND, pedal edema, claudication. Remaining systems are negative.  Physical Exam: Well-developed well-nourished in no acute distress.  Skin is warm and dry.  HEENT is normal.  Neck is supple. No bruits Chest is clear to auscultation with normal expansion.  Cardiovascular exam is regular rate and rhythm. No murmurs Abdominal exam nontender or distended. No masses palpated. Extremities show no edema. neuro grossly intact  ECG-Sinus rhythm at a rate of 61. No ST changes.  A/P  1 . Paroxysmal atrial fibrillation-patient remains in sinus rhythm. Continue Toprol. If he has more frequent episodes in the future we could consider an antiarrhythmic. CHADSvasc 0. No anticoagulation indicated. Continue ASA.  2 tobacco abuse-patient counseled on discontinuing.    Olga MillersBrian Crenshaw, MD

## 2016-04-11 ENCOUNTER — Other Ambulatory Visit: Payer: Self-pay | Admitting: Cardiology

## 2016-04-11 MED ORDER — METOPROLOL SUCCINATE ER 25 MG PO TB24: 25.0000 mg | ORAL_TABLET | Freq: Every day | ORAL | 0 refills | Status: DC

## 2016-04-11 NOTE — Telephone Encounter (Signed)
Rx(s) sent to pharmacy electronically.  

## 2016-04-11 NOTE — Telephone Encounter (Signed)
°*  STAT* If patient is at the pharmacy, call can be transferred to refill team.   1. Which medications need to be refilled? (please list name of each medication and dose if known) Need Metoprolol,out of it.Pt has an appointment on 04-16-16  2. Which pharmacy/location (including street and city if local pharmacy) is medication to be sent to?[facility] [phone]  3. Do they need a 30 day or 90 day supply? 30 if possible if not 6

## 2016-04-16 ENCOUNTER — Ambulatory Visit (INDEPENDENT_AMBULATORY_CARE_PROVIDER_SITE_OTHER): Payer: BC Managed Care – PPO | Admitting: Cardiology

## 2016-04-16 ENCOUNTER — Encounter: Payer: Self-pay | Admitting: Cardiology

## 2016-04-16 VITALS — BP 120/68 | HR 61 | Ht 71.0 in | Wt 160.0 lb

## 2016-04-16 DIAGNOSIS — I4891 Unspecified atrial fibrillation: Secondary | ICD-10-CM

## 2016-04-16 MED ORDER — METOPROLOL SUCCINATE ER 25 MG PO TB24: 25.0000 mg | ORAL_TABLET | Freq: Every day | ORAL | 3 refills | Status: DC

## 2016-04-16 NOTE — Patient Instructions (Signed)
Your physician wants you to follow-up in: ONE YEAR WITH DR CRENSHAW You will receive a reminder letter in the mail two months in advance. If you don't receive a letter, please call our office to schedule the follow-up appointment.   If you need a refill on your cardiac medications before your next appointment, please call your pharmacy.  

## 2016-05-13 ENCOUNTER — Other Ambulatory Visit: Payer: Self-pay | Admitting: Cardiology

## 2016-05-13 NOTE — Telephone Encounter (Signed)
metoprolol succinate (TOPROL-XL) 25 MG 24 hr tablet 90 tablet 3 04/16/2016    Sig - Route: Take 1 tablet (25 mg total) by mouth daily. - Oral   E-Prescribing Status: Receipt confirmed by pharmacy (04/16/2016 5:15 PM EST)   Associated Diagnoses   Atrial fibrillation, unspecified type [facility](HCC) - Primary     [id number] [place] [place] - [address]

## 2016-05-13 NOTE — Telephone Encounter (Signed)
°*  STAT* If patient is at the pharmacy, call can be transferred to refill team.   1. Which medications need to be refilled? (please list name of each medication and dose if known)Metoprolol .Marland Kitchen. Needs a Prescription Renewal   2. Which pharmacy/location (including street and city if local pharmacy) is medication to be sent to? [facility] [address]   3. Do they need a 30 day or 90 day supply? 90

## 2017-05-19 ENCOUNTER — Telehealth: Payer: Self-pay | Admitting: Cardiology

## 2017-05-19 DIAGNOSIS — I4891 Unspecified atrial fibrillation: Secondary | ICD-10-CM

## 2017-05-19 MED ORDER — METOPROLOL SUCCINATE ER 25 MG PO TB24: 25.0000 mg | ORAL_TABLET | Freq: Every day | ORAL | 0 refills | Status: DC

## 2017-05-19 NOTE — Telephone Encounter (Signed)
New Message     *STAT* If patient is at the pharmacy, call can be transferred to refill team.   1. Which medications need to be refilled? (please list name of each medication and dose if known) metoprolol succinate (TOPROL-XL) 25 MG 24 hr tablet  2. Which pharmacy/location (including street and city if local pharmacy) is medication to be sent to? [facility] [address]   3. Do they need a 30 day or 90 day supply? 90 day

## 2017-07-27 NOTE — Progress Notes (Signed)
Cardiology Office Note   Date:  07/28/2017   ID:  George Cordova, DOB 02/23/1971, MRN [id number]  PCP:  Jarome MatinPaterson, Daniel, MD  Cardiologist:  Jens Somrenshaw Chief Complaint  Patient presents with  . Follow-up    pt states feels flutter more with stress, denies SOB, swelling, chest pains  . Atrial Fibrillation    Paroxysmal     History of Present Illness: George Cordova is a 47 y.o. male who presents for ongoing assessment and management of atrial fib, converted to NSR on flecanide, hyperlipidemia, tobacco abuse, and asthma.  Was last seen by Dr. Jens Somrenshaw on 04/16/2016. At that time he remained in NSR and continued on metoprolol.   He comes today for medication refills.  He states that his heart rate is been predominantly well controlled without any paroxysms with exception of 1 which occurred under a moment of extreme stress which he did not elaborate on.  The atrial fibrillation went away after about 10 minutes according to the patient report.  He has been medically compliant as offers no complaints of dyspnea, fatigue, dizziness, or chest pain.  Past Medical History:  Diagnosis Date  . Asthma   . Atrial fibrillation (HCC)   . Hyperlipidemia     Past Surgical History:  Procedure Laterality Date  . ANKLE SURGERY       Current Outpatient Medications  Medication Sig Dispense Refill  . aspirin EC 325 MG tablet Take 1 tablet (325 mg total) by mouth daily. 30 tablet 0  . metoprolol succinate (TOPROL-XL) 25 MG 24 hr tablet Take 1 tablet (25 mg total) by mouth daily. 90 tablet 3  . VITAMIN D, ERGOCALCIFEROL, PO Take 1 capsule by mouth daily.    . Ascorbic Acid (VITAMIN C PO) Take 1 tablet by mouth daily.    . Cholecalciferol (VITAMIN D PO) Take 1 tablet by mouth daily.    . Fish Oil-Cholecalciferol (FISH OIL + D3 PO) Take 1 capsule by mouth daily.     No current facility-administered medications for this visit.     Allergies:   Patient has no known allergies.    Social History:  The  patient  reports that he has been smoking cigarettes.  He has never used smokeless tobacco. He reports that he does not drink alcohol or use drugs.   Family History:  The patient's family history includes Heart disease in his unknown relative.    ROS: All other systems are reviewed and negative. Unless otherwise mentioned in H&P    PHYSICAL EXAM: VS:  BP 120/78   Pulse 83   Wt 165 lb (74.8 kg)   BMI 23.01 kg/m  , BMI Body mass index is 23.01 kg/m. GEN: Well nourished, well developed, in no acute distress  HEENT: normal  Neck: no JVD, carotid bruits, or masses Cardiac: RRR; no murmurs, rubs, or gallops,no edema  Respiratory:  Clear to auscultation bilaterally, normal work of breathing GI: soft, nontender, nondistended, + BS MS: no deformity or atrophy  Skin: warm and dry, no rash Neuro:  Strength and sensation are intact Psych: euthymic mood, full affect   EKG: Normal sinus rhythm heart rate of 83 bpm  Recent Labs: No results found for requested labs within last 8760 hours.    Lipid Panel No results found for: CHOL, TRIG, HDL, CHOLHDL, VLDL, LDLCALC, LDLDIRECT    Wt Readings from Last 3 Encounters:  07/28/17 165 lb (74.8 kg)  04/16/16 160 lb (72.6 kg)  06/21/14 148 lb (67.1 kg)  Other studies Reviewed: Echocardiogram 27-Apr-2014.  Left ventricle: The cavity size was normal. Wall thickness was normal. Systolic function was normal. The estimated ejection fraction was in the range of 50% to 55%. Wall motion was normal; there were no regional wall motion abnormalities. Left ventricular diastolic function parameters were normal.  ASSESSMENT AND PLAN:  1.  Paroxysmal atrial fibrillation: The patient reports that his heart rate is been essentially normal except for one episode when he did feel rapid irregular heart rate during a time of extreme stress which he did not elaborate on.  He has had no further paroxysms since that time.  He is medically compliant.   CHADS VASC 0.  He will continue current medication regimen with refills on his metoprolol.  2.  Hypercholesterolemia: This is followed by his PCP Dr. Jarold Motto.  He is due to have an appointment with him within the next few months.  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  I have asked him to send [place] a copy of the latest labs once they are drawn.  He does not wish to have any labs drawn by our practice today.   Current medicines are reviewed at length with the patient today.    Labs/ tests ordered today include: None but did not make you come back tomorrow that your punishment by you and then make you,  Bettey Mare. Liborio Nixon, ANP, AACC   07/28/2017 4:43 PM    [facility] [address] [place], [place] [address] Phone: [phone]; Fax: [phone]

## 2017-07-28 ENCOUNTER — Ambulatory Visit: Payer: BC Managed Care – PPO | Admitting: Adult Health

## 2017-07-28 ENCOUNTER — Encounter: Payer: Self-pay | Admitting: Adult Health

## 2017-07-28 VITALS — BP 120/78 | HR 83 | Ht 72.0 in | Wt 165.0 lb

## 2017-07-28 DIAGNOSIS — I4891 Unspecified atrial fibrillation: Secondary | ICD-10-CM | POA: Diagnosis not present

## 2017-07-28 DIAGNOSIS — E7800 Pure hypercholesterolemia, unspecified: Secondary | ICD-10-CM | POA: Diagnosis not present

## 2017-07-28 MED ORDER — METOPROLOL SUCCINATE ER 25 MG PO TB24: 25.0000 mg | ORAL_TABLET | Freq: Every day | ORAL | 3 refills | Status: DC

## 2017-07-28 NOTE — Patient Instructions (Signed)
Medication Instructions:  NO CHANGES- Your physician recommends that you continue on your current medications as directed. Please refer to the Current Medication list given to you today.  If you need a refill on your cardiac medications before your next appointment, please call your pharmacy.  Follow-Up: Your physician wants you to follow-up in: 12 MONTHS WITH DR Jens SomRENSHAW You should receive a reminder letter in the mail two months in advance. If you do not receive a letter, please call our office [phone] to schedule the 05-2018 follow-up appointment.   Thank you [facility] choosing [facility]!!

## 2018-08-28 ENCOUNTER — Other Ambulatory Visit: Payer: Self-pay | Admitting: *Deleted

## 2018-08-28 DIAGNOSIS — I4891 Unspecified atrial fibrillation: Secondary | ICD-10-CM

## 2018-08-28 MED ORDER — METOPROLOL SUCCINATE ER 25 MG PO TB24: 25.0000 mg | ORAL_TABLET | Freq: Every day | ORAL | 1 refills | Status: DC

## 2018-12-07 NOTE — Progress Notes (Signed)
HPI: FU atrial fibrillation. Seen in ER 1/16 with atrial fibrillation. Patient converted to sinus with flecanide 300 mg po x 1. Echo 1/16 showed normal LV function. TSH, renal function and hemoglobin normal. Since last seen,  patient denies dyspnea, chest pain or syncope.  Occasional brief flutter but no sustained palpitations similar to his atrial fibrillation.  Current Outpatient Medications  Medication Sig Dispense Refill  . Ascorbic Acid (VITAMIN C PO) Take 1 tablet by mouth daily.    Marland Kitchen aspirin EC 325 MG tablet Take 1 tablet (325 mg total) by mouth daily. 30 tablet 0  . Cholecalciferol (VITAMIN D PO) Take 1 tablet by mouth daily.    . Fish Oil-Cholecalciferol (FISH OIL + D3 PO) Take 1 capsule by mouth daily.    . metoprolol succinate (TOPROL-XL) 25 MG 24 hr tablet Take 1 tablet (25 mg total) by mouth daily. 90 tablet 1  . VITAMIN D, ERGOCALCIFEROL, PO Take 1 capsule by mouth daily.     No current facility-administered medications for this visit.      Past Medical History:  Diagnosis Date  . Asthma   . Atrial fibrillation ([place])   . Hyperlipidemia     Past Surgical History:  Procedure Laterality Date  . ANKLE SURGERY      Social History   Socioeconomic History  . Marital status: Married    Spouse name: Not on file  . Number of children: 2  . Years of education: Not on file  . Highest education level: Not on file  Occupational History    Comment: Teacher  Social Needs  . Financial resource strain: Not on file  . Food insecurity    Worry: Not on file    Inability: Not on file  . Transportation needs    Medical: Not on file    Non-medical: Not on file  Tobacco Use  . Smoking status: Current Every Day Smoker    Types: Cigarettes  . Smokeless tobacco: Never Used  Substance and Sexual Activity  . Alcohol use: No    Alcohol/week: 0.0 standard drinks  . Drug use: No  . Sexual activity: Not on file  Lifestyle  . Physical activity    Days per week: Not on  file    Minutes per session: Not on file  . Stress: Not on file  Relationships  . Social Herbalist on phone: Not on file    Gets together: Not on file    Attends religious service: Not on file    Active member of club or organization: Not on file    Attends meetings of clubs or organizations: Not on file    Relationship status: Not on file  . Intimate partner violence    Fear of current or ex partner: Not on file    Emotionally abused: Not on file    Physically abused: Not on file    Forced sexual activity: Not on file  Other Topics Concern  . Not on file  Social History Narrative  . Not on file    Family History  Problem Relation Age of Onset  . Heart disease Other        No family history    ROS: no fevers or chills, productive cough, hemoptysis, dysphasia, odynophagia, melena, hematochezia, dysuria, hematuria, rash, seizure activity, orthopnea, PND, pedal edema, claudication. Remaining systems are negative.  Physical Exam: Well-developed well-nourished in no acute distress.  Skin is warm and dry.  HEENT is  normal.  Neck is supple.  Chest is clear to auscultation with normal expansion.  Cardiovascular exam is regular rate and rhythm.  Abdominal exam nontender or distended. No masses palpated. Extremities show no edema. neuro grossly intact  ECG-sinus bradycardia, RV conduction delay.  Personally reviewed  A/P  1 paroxysmal atrial fibrillation-patient remains in sinus rhythm.  Continue Toprol at present dose.  If he has more frequent episodes in the future we will consider antiarrhythmic such as flecainide.  No anticoagulation given CHADSvasc 0.  Decrease aspirin to 81 mg daily.  2 tobacco abuse-patient counseled on discontinuing.  3 hyperlipidemia-managed by primary care.  Olga MillersBrian , MD

## 2018-12-08 ENCOUNTER — Ambulatory Visit: Payer: BC Managed Care – PPO | Admitting: Cardiology

## 2018-12-08 ENCOUNTER — Encounter: Payer: Self-pay | Admitting: Cardiology

## 2018-12-08 ENCOUNTER — Other Ambulatory Visit: Payer: Self-pay

## 2018-12-08 VITALS — BP 112/76 | HR 57 | Temp 97.5°F | Ht 70.0 in | Wt 167.0 lb

## 2018-12-08 DIAGNOSIS — I4891 Unspecified atrial fibrillation: Secondary | ICD-10-CM | POA: Diagnosis not present

## 2018-12-08 DIAGNOSIS — Z720 Tobacco use: Secondary | ICD-10-CM

## 2018-12-08 DIAGNOSIS — E7800 Pure hypercholesterolemia, unspecified: Secondary | ICD-10-CM | POA: Diagnosis not present

## 2018-12-08 MED ORDER — ASPIRIN 81 MG PO TBEC: 81.0000 mg | DELAYED_RELEASE_TABLET | Freq: Every day | ORAL | Status: AC

## 2018-12-08 NOTE — Patient Instructions (Signed)
Medication Instructions:  DECREASE ASPIRIN TO 81 MG ONCE DAILY If you need a refill on your cardiac medications before your next appointment, please call your pharmacy.   Lab work: If you have labs (blood work) drawn today and your tests are completely normal, you will receive your results only by: . MyChart Message (if you have MyChart) OR . A paper copy in the mail If you have any lab test that is abnormal or we need to change your treatment, we will call you to review the results.  Follow-Up: At [facility], you and your health needs are our priority.  As part of our continuing mission to provide you with exceptional heart care, we have created designated Provider Care Teams.  These Care Teams include your primary Cardiologist (physician) and Advanced Practice Providers (APPs -  Physician Assistants and Nurse Practitioners) who all work together to provide you with the care you need, when you need it. You will need a follow up appointment in 12 months.  Please call our office 2 months in advance to schedule this appointment.  You may see BRIAN CRENSHAW MD or one of the following Advanced Practice Providers on your designated Care Team:   Luke Kilroy, PA-C Krista Kroeger, PA-C . Callie Goodrich, PA-C     

## 2019-03-01 ENCOUNTER — Other Ambulatory Visit: Payer: Self-pay | Admitting: Cardiology

## 2019-03-01 DIAGNOSIS — I4891 Unspecified atrial fibrillation: Secondary | ICD-10-CM

## 2019-03-01 NOTE — Telephone Encounter (Signed)
*  STAT* If patient is at the pharmacy, call can be transferred to refill team.   1. Which medications need to be refilled? (please list name of each medication and dose if known) metoprolol succinate (TOPROL-XL) 25 MG 24 hr tablet   2. Which pharmacy/location (including street and city if local pharmacy) is medication to be sent to? [facility] - [place], [place] - [address]   3. Do they need a 30 day or 90 day supply? 90  

## 2019-03-02 MED ORDER — METOPROLOL SUCCINATE ER 25 MG PO TB24: 25.0000 mg | ORAL_TABLET | Freq: Every day | ORAL | 3 refills | Status: DC

## 2019-11-25 ENCOUNTER — Telehealth: Payer: Self-pay | Admitting: *Deleted

## 2019-11-25 NOTE — Telephone Encounter (Signed)
A message was left, re: his follow up visit. 

## 2020-02-17 ENCOUNTER — Telehealth: Payer: Self-pay | Admitting: Cardiology

## 2020-02-17 ENCOUNTER — Encounter: Payer: Self-pay | Admitting: General Practice

## 2020-02-17 NOTE — Telephone Encounter (Signed)
  Recall expunge letter sent for overdue 1 yr f/u

## 2020-02-28 NOTE — Progress Notes (Signed)
Cardiology Clinic Note   Patient Name: George Cordova Date of Encounter: 02/29/2020  Primary Care Provider:  Jarome Matin, MD Primary Cardiologist:  Olga Millers, MD  Patient Profile    George Cordova 49 year old male presents to the clinic today for follow-up evaluation of his atrial fibrillation.  Past Medical History    Past Medical History:  Diagnosis Date  . Asthma   . Atrial fibrillation ([facility])   . Hyperlipidemia    Past Surgical History:  Procedure Laterality Date  . ANKLE SURGERY      Allergies  No Known Allergies  History of Present Illness    George Cordova has a PMH of atrial fibrillation and tobacco abuse.  He was last seen by Dr. Jens Som on 12/08/2018.  He was initially seen in the emergency department 1/16 with atrial fibrillation.  He converted to sinus rhythm with flecainide 200 mg p.o. x1.  His echocardiogram 1/16 showed normal LVEF.  His TSH, renal function and hemoglobin were normal.  He denied dyspnea, chest pain, and syncope.  He did note an occasional short-lived/brief letter but denied sustained palpitations similar to his atrial fibrillation.  He presents the clinic today for follow-up evaluation and states he feels well.  He notices intermittent episodes of palpitations in the evening.  He describes these as brief and lasting for a few seconds.  He reports compliance with his metoprolol.  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  He reports dietary indiscretion and eating some convenience type foods.  He has not had labs drawn in quite some time.  We will repeat his CBC, BMP, and lipid panel today.  I will refill his metoprolol and have him follow-up in 12 months.  Today he denies chest pain, shortness of breath, lower extremity edema, fatigue, melena, hematuria, hemoptysis, diaphoresis, weakness, presyncope, syncope, orthopnea, and PND.   Home Medications    Prior to Admission medications   Medication  Sig Start Date End Date Taking? Authorizing Provider  Ascorbic Acid (VITAMIN C PO) Take 1 tablet by mouth daily.    [provider]  aspirin EC 81 MG EC tablet Take 1 tablet (81 mg total) by mouth daily. 12/08/18   Lewayne Bunting, MD  Cholecalciferol (VITAMIN D PO) Take 1 tablet by mouth daily.    [provider]  Fish Oil-Cholecalciferol (FISH OIL + D3 PO) Take 1 capsule by mouth daily.    [provider]  metoprolol succinate (TOPROL-XL) 25 MG 24 hr tablet Take 1 tablet (25 mg total) by mouth daily. 03/02/19   Lewayne Bunting, MD  VITAMIN D, ERGOCALCIFEROL, PO Take 1 capsule by mouth daily.    [provider]    Family History    Family History  Problem Relation Age of Onset  . Heart disease Other        No family history   He indicated that the status of his other is unknown.  Social History    Social History   Socioeconomic History  . Marital status: Married    Spouse name: Not on file  . Number of children: 2  . Years of education: Not on file  . Highest education level: Not on file  Occupational History    Comment: Teacher  Tobacco Use  . Smoking status: Current Every Day Smoker    Types: Cigarettes  . Smokeless tobacco: Never Used  Substance and Sexual Activity  . Alcohol use: No  Alcohol/week: 0.0 standard drinks  . Drug use: No  . Sexual activity: Not on file  Other Topics Concern  . Not on file  Social History Narrative  . Not on file   Social Determinants of Health   Financial Resource Strain:   . Difficulty of Paying Living Expenses: Not on file  Food Insecurity:   . Worried About Programme researcher, broadcasting/film/video in the Last Year: Not on file  . Ran Out of Food in the Last Year: Not on file  Transportation Needs:   . Lack of Transportation (Medical): Not on file  . Lack of Transportation (Non-Medical): Not on file  Physical Activity:   . Days of Exercise per Week: Not on file  . Minutes of Exercise per Session: Not on  file  Stress:   . Feeling of Stress : Not on file  Social Connections:   . Frequency of Communication with Friends and Family: Not on file  . Frequency of Social Gatherings with Friends and Family: Not on file  . Attends Religious Services: Not on file  . Active Member of Clubs or Organizations: Not on file  . Attends Banker Meetings: Not on file  . Marital Status: Not on file  Intimate Partner Violence:   . Fear of Current or Ex-Partner: Not on file  . Emotionally Abused: Not on file  . Physically Abused: Not on file  . Sexually Abused: Not on file     Review of Systems    General:  No chills, fever, night sweats or weight changes.  Cardiovascular:  No chest pain, dyspnea on exertion, edema, orthopnea, palpitations, paroxysmal nocturnal dyspnea. Dermatological: No rash, lesions/masses Respiratory: No cough, dyspnea Urologic: No hematuria, dysuria Abdominal:   No nausea, vomiting, diarrhea, bright red blood per rectum, melena, or hematemesis Neurologic:  No visual changes, wkns, changes in mental status. All other systems reviewed and are otherwise negative except as noted above.  Physical Exam    VS:  BP 124/72   Pulse 84   Ht 5\' 10"  (1.778 m)   Wt 171 lb 6.4 oz (77.7 kg)   BMI 24.59 kg/m  , BMI Body mass index is 24.59 kg/m. GEN: Well nourished, well developed, in no acute distress. HEENT: normal. Neck: Supple, no JVD, carotid bruits, or masses. Cardiac: RRR, no murmurs, rubs, or gallops. No clubbing, cyanosis, edema.  Radials/DP/PT 2+ and equal bilaterally.  Respiratory:  Respirations regular and unlabored, clear to auscultation bilaterally. GI: Soft, nontender, nondistended, BS + x 4. MS: no deformity or atrophy. Skin: warm and dry, no rash. Neuro:  Strength and sensation are intact. Psych: Normal affect.  Accessory Clinical Findings    Recent Labs: No results found for requested labs within last 8760 hours.   Recent Lipid Panel No results found  for: CHOL, TRIG, HDL, CHOLHDL, VLDL, LDLCALC, LDLDIRECT  ECG personally reviewed by me today-normal sinus rhythm 84 bpm no ST or T wave deviation- No acute changes  Echocardiogram 04/22/2014 Study Conclusions   - Left ventricle: The cavity size was normal. Wall thickness was  normal. Systolic function was normal. The estimated ejection  fraction was in the range of 50% to 55%. Wall motion was normal;  there were no regional wall motion abnormalities. Left  ventricular diastolic function parameters were normal.   Impressions:   - Normal study.    Assessment & Plan   1.  Paroxysmal atrial fibrillation-EKG today shows normal sinus rhythm 84 bpm no ST or T wave deviation.  No recent episodes of accelerated heart rate or skipped beats.  CHA2DS2-VASc score 0.  We reviewed chads vas scoring. Continue aspirin, metoprolol Avoid triggers caffeine, chocolate, EtOH etc. Heart healthy low-sodium diet-salty 6 given Increase physical activity as tolerated Order BMP, CBC, lipid panel  Hyperlipidemia-inactive and takes fish oil Heart healthy low-sodium high-fiber diet Increase physical activity as tolerated Order lipid panel  Disposition: Follow-up with Dr. Jens Som or me in 12 months.  Thomasene Ripple. Ophie Burrowes NP-C    02/29/2020, 2:01 PM [facility] [address] Office [phone] Fax [phone]  Notice: This dictation was prepared with Dragon dictation along with smaller phrase technology. Any transcriptional errors that result from this process are unintentional and may not be corrected upon review.

## 2020-02-29 ENCOUNTER — Other Ambulatory Visit: Payer: Self-pay

## 2020-02-29 ENCOUNTER — Encounter: Payer: Self-pay | Admitting: General Practice

## 2020-02-29 ENCOUNTER — Ambulatory Visit: Payer: BC Managed Care – PPO | Admitting: General Practice

## 2020-02-29 VITALS — BP 124/72 | HR 84 | Ht 70.0 in | Wt 171.4 lb

## 2020-02-29 DIAGNOSIS — I4891 Unspecified atrial fibrillation: Secondary | ICD-10-CM

## 2020-02-29 DIAGNOSIS — E7800 Pure hypercholesterolemia, unspecified: Secondary | ICD-10-CM | POA: Diagnosis not present

## 2020-02-29 DIAGNOSIS — Z79899 Other long term (current) drug therapy: Secondary | ICD-10-CM | POA: Diagnosis not present

## 2020-02-29 DIAGNOSIS — Z720 Tobacco use: Secondary | ICD-10-CM

## 2020-02-29 LAB — CBC: Platelets: 199 10*3/uL (ref 150–450)

## 2020-02-29 LAB — BASIC METABOLIC PANEL

## 2020-02-29 MED ORDER — METOPROLOL SUCCINATE ER 25 MG PO TB24: 25.0000 mg | ORAL_TABLET | Freq: Every day | ORAL | 3 refills | Status: DC

## 2020-02-29 NOTE — Patient Instructions (Addendum)
Medication Instructions:  The current medical regimen is effective;  continue present plan and medications as directed. Please refer to the Current Medication list given to you today. *If you need a refill on your cardiac medications before your next appointment, please call your pharmacy*  Lab Work: FASTING LIPID.CBC AND BMET-TODAY If you have labs (blood work) drawn today and your tests are completely normal, you will receive your results only by:  MyChart Message (if you have MyChart) OR A paper copy in the mail.  If you have any lab test that is abnormal or we need to change your treatment, we will call you to review the results. You may go to any [facility] that is convenient for you however, we do have a lab in our office that is able to assist you. You DO NOT need an appointment for our lab. The lab is open 8:00am and closes at 4:00pm. Lunch 12:45 - 1:45pm.  Special Instructions PLEASE READ AND FIBER DIET-ATTACHED  PLEASE READ AND FOLLOW SALTY 6-ATTACHED-1,800 mg daily  Please try to avoid these triggers:  Do not use any products that have nicotine or tobacco in them. These include cigarettes, e-cigarettes, and chewing tobacco. If you need help quitting, ask your doctor.  Eat heart-healthy foods. Talk with your doctor about the right eating plan for you.  Exercise regularly as told by your doctor.  Do not drink alcohol, Caffeine or chocolate.  Lose weight if you are overweight.  Do not use drugs, including cannabis     Follow-Up: Your next appointment:  12 month(s) In Person with You may see Olga Millers, MD or one of the following Advanced Practice Providers on your designated Care Team:  Corine Shelter, PA-C [place], [place] Edd Fabian, [place].    Please call our office 2 months in advance to schedule this appointment   At [facility], you and your health needs are our priority.  As part of our continuing mission to provide you with exceptional heart care, we have  created designated Provider Care Teams.  These Care Teams include your primary Cardiologist (physician) and Advanced Practice Providers (APPs -  Physician Assistants and Nurse Practitioners) who all work together to provide you with the care you need, when you need it.  We recommend signing up for the patient portal called "MyChart".  Sign up information is provided on this After Visit Summary.  MyChart is used to connect with patients for Virtual Visits (Telemedicine).  Patients are able to view lab/test results, encounter notes, upcoming appointments, etc.  Non-urgent messages can be sent to your provider as well.   To learn more about what you can do with MyChart, go to [email or web address].        High-Fiber Diet Fiber, also called dietary fiber, is a type of carbohydrate that is found in fruits, vegetables, whole grains, and beans. A high-fiber diet can have many health benefits. Your health care provider may recommend a high-fiber diet to help:  Prevent constipation. Fiber can make your bowel movements more regular.  Lower your cholesterol.  Relieve the following conditions: ? Swelling of veins in the anus (hemorrhoids). ? Swelling and irritation (inflammation) of specific areas of the digestive tract (uncomplicated diverticulosis). ? A problem of the large intestine (colon) that sometimes causes pain and diarrhea (irritable bowel syndrome, IBS).  Prevent overeating as part of a weight-loss plan.  Prevent heart disease, type 2 diabetes, and certain cancers. What is my plan? The recommended daily fiber intake in grams (g) includes:  38 g for men age 32 or younger.  30 g for men over age 58.  25 g for women age 66 or younger.  21 g for women over age 19. You can get the recommended daily intake of dietary fiber by:  Eating a variety of fruits, vegetables, grains, and beans.  Taking a fiber supplement, if it is not possible to get enough fiber through your diet. What do I  need to know about a high-fiber diet?  It is better to get fiber through food sources rather than from fiber supplements. There is not a lot of research about how effective supplements are.  Always check the fiber content on the nutrition facts label of any prepackaged food. Look for foods that contain 5 g of fiber or more per serving.  Talk with a diet and nutrition specialist (dietitian) if you have questions about specific foods that are recommended or not recommended for your medical condition, especially if those foods are not listed below.  Gradually increase how much fiber you consume. If you increase your intake of dietary fiber too quickly, you may have bloating, cramping, or gas.  Drink plenty of water. Water helps you to digest fiber. What are tips for following this plan?  Eat a wide variety of high-fiber foods.  Make sure that half of the grains that you eat each day are whole grains.  Eat breads and cereals that are made with whole-grain flour instead of refined flour or white flour.  Eat brown rice, bulgur wheat, or millet instead of white rice.  Start the day with a breakfast that is high in fiber, such as a cereal that contains 5 g of fiber or more per serving.  Use beans in place of meat in soups, salads, and pasta dishes.  Eat high-fiber snacks, such as berries, raw vegetables, nuts, and popcorn.  Choose whole fruits and vegetables instead of processed forms like juice or sauce. What foods can I eat?  Fruits Berries. Pears. Apples. Oranges. Avocado. Prunes and raisins. Dried figs. Vegetables Sweet potatoes. Spinach. Kale. Artichokes. Cabbage. Broccoli. Cauliflower. Green peas. Carrots. Squash. Grains Whole-grain breads. Multigrain cereal. Oats and oatmeal. Brown rice. Barley. Bulgur wheat. Millet. Quinoa. Bran muffins. Popcorn. Rye wafer crackers. Meats and other proteins Navy, kidney, and pinto beans. Soybeans. Split peas. Lentils. Nuts and  seeds. Dairy Fiber-fortified yogurt. Beverages Fiber-fortified soy milk. Fiber-fortified orange juice. Other foods Fiber bars. The items listed above may not be a complete list of recommended foods and beverages. Contact a dietitian for more options. What foods are not recommended? Fruits Fruit juice. Cooked, strained fruit. Vegetables Fried potatoes. Canned vegetables. Well-cooked vegetables. Grains White bread. Pasta made with refined flour. White rice. Meats and other proteins Fatty cuts of meat. Fried chicken or fried fish. Dairy Milk. Yogurt. Cream cheese. Sour cream. Fats and oils Butters. Beverages Soft drinks. Other foods Cakes and pastries. The items listed above may not be a complete list of foods and beverages to avoid. Contact a dietitian for more information. Summary  Fiber is a type of carbohydrate. It is found in fruits, vegetables, whole grains, and beans.  There are many health benefits of eating a high-fiber diet, such as preventing constipation, lowering blood cholesterol, helping with weight loss, and reducing your risk of heart disease, diabetes, and certain cancers.  Gradually increase your intake of fiber. Increasing too fast can result in cramping, bloating, and gas. Drink plenty of water while you increase your fiber.  The best sources of  fiber include whole fruits and vegetables, whole grains, nuts, seeds, and beans. This information is not intended to replace advice given to you by your health care provider. Make sure you discuss any questions you have with your health care provider. Document Revised: 01/27/2017 Document Reviewed: 01/27/2017 Elsevier Patient Education  2020 [employer].

## 2020-03-01 LAB — BASIC METABOLIC PANEL
BUN/Creatinine Ratio: 16 (ref 9–20)
BUN: 17 mg/dL (ref 6–24)
CO2: 23 mmol/L (ref 20–29)
Calcium: 10 mg/dL (ref 8.7–10.2)
Creatinine, Ser: 1.07 mg/dL (ref 0.76–1.27)
GFR calc Af Amer: 94 mL/min/{1.73_m2} (ref >59)
GFR calc non Af Amer: 81 mL/min/{1.73_m2} (ref >59)
Glucose: 84 mg/dL (ref 65–99)
Potassium: 4.7 mmol/L (ref 3.5–5.2)

## 2020-03-01 LAB — CBC
Hematocrit: 43.3 % (ref 37.5–51.0)
Hemoglobin: 14.8 g/dL (ref 13.0–17.7)
MCH: 29.8 pg (ref 26.6–33.0)
MCHC: 34.2 g/dL (ref 31.5–35.7)
MCV: 87 fL (ref 79–97)
RBC: 4.96 x10E6/uL (ref 4.14–5.80)
RDW: 12.8 % (ref 11.6–15.4)
WBC: 6.2 10*3/uL (ref 3.4–10.8)

## 2020-03-01 LAB — LIPID PANEL
Chol/HDL Ratio: 5.6 ratio — ABNORMAL HIGH (ref 0.0–5.0)
Cholesterol, Total: 246 mg/dL — ABNORMAL HIGH (ref 100–199)
HDL: 44 mg/dL (ref >39)
LDL Chol Calc (NIH): 176 mg/dL — ABNORMAL HIGH (ref 0–99)
Triglycerides: 144 mg/dL (ref 0–149)
VLDL Cholesterol Cal: 26 mg/dL (ref 5–40)

## 2020-03-07 ENCOUNTER — Telehealth: Payer: Self-pay | Admitting: Cardiology

## 2020-03-07 ENCOUNTER — Other Ambulatory Visit: Payer: Self-pay

## 2020-03-07 DIAGNOSIS — Z79899 Other long term (current) drug therapy: Secondary | ICD-10-CM

## 2020-03-07 DIAGNOSIS — E7800 Pure hypercholesterolemia, unspecified: Secondary | ICD-10-CM

## 2020-03-07 MED ORDER — ATORVASTATIN CALCIUM 40 MG PO TABS: 40.0000 mg | ORAL_TABLET | Freq: Every day | ORAL | 3 refills | Status: DC

## 2020-03-07 NOTE — Telephone Encounter (Signed)
Patient returning call for results. He states if he does not answer the phone when we call him again that it is okay to leave a detailed voicemail.

## 2020-03-07 NOTE — Telephone Encounter (Signed)
Left a message for the patient to call back to discuss starting the new medication and repeat labs.   Please contact George Cordova and let him know that his lab values from yesterday have been reviewed. His CBC is normal, his BMP is normal. His cholesterol is elevated at 246 and his LDL is 176. He needs to increase his physical activity with a goal of 150 minutes of moderate physical activity per week. He also needs to increase the fiber in his diet and avoid foods high in cholesterol (meats, dairy products, and fried /fatty foods). We will order atorvastatin 40 mg daily. Repeat fasting lipid panel and LFTs in 6 weeks. Thank you.

## 2020-07-14 ENCOUNTER — Other Ambulatory Visit: Payer: Self-pay

## 2020-07-14 ENCOUNTER — Telehealth: Payer: Self-pay | Admitting: Cardiology

## 2020-07-14 NOTE — Telephone Encounter (Signed)
*  STAT* If patient is at the pharmacy, call can be transferred to refill team.   1. Which medications need to be refilled? (please list name of each medication and dose if known)  atorvastatin (LIPITOR) 40 MG tablet  2. Which pharmacy/location (including street and city if local pharmacy) is medication to be sent to? [facility] [address] [place] - [address]  3. Do they need a 30 day or 90 day supply? 90 with refills

## 2020-07-16 ENCOUNTER — Other Ambulatory Visit: Payer: Self-pay | Admitting: General Practice

## 2021-02-17 ENCOUNTER — Other Ambulatory Visit: Payer: Self-pay | Admitting: Cardiology

## 2021-02-17 DIAGNOSIS — I4891 Unspecified atrial fibrillation: Secondary | ICD-10-CM

## 2021-03-08 ENCOUNTER — Telehealth: Payer: Self-pay | Admitting: Cardiology

## 2021-03-08 DIAGNOSIS — I4891 Unspecified atrial fibrillation: Secondary | ICD-10-CM

## 2021-03-08 NOTE — Telephone Encounter (Signed)
*  STAT* If patient is at the pharmacy, call can be transferred to refill team.   1. Which medications need to be refilled? (please list name of each medication and dose if known) metoprolol succinate (TOPROL-XL) 25 MG 24 hr tablet  2. Which pharmacy/location (including street and city if local pharmacy) is medication to be sent to? [facility] - [place], [place] - [address]  3. Do they need a 30 day or 90 day supply? 90 ds

## 2021-03-08 NOTE — Telephone Encounter (Signed)
Pt is wanting a full supply instead of 15 days at a time. Pts appt is not until Feb 2023.. please advise

## 2021-03-09 MED ORDER — METOPROLOL SUCCINATE ER 25 MG PO TB24: 25.0000 mg | ORAL_TABLET | Freq: Every day | ORAL | 0 refills | Status: DC

## 2021-03-09 NOTE — Telephone Encounter (Signed)
Called patient and left a voice message informing him that I will send over a refill that will get him through to his appointment in February with Azalee Course, PA-C and to give our office a cal if he has any questions

## 2021-04-14 ENCOUNTER — Other Ambulatory Visit: Payer: Self-pay | Admitting: Cardiology

## 2021-05-28 ENCOUNTER — Encounter: Payer: Self-pay | Admitting: Physician Assistant

## 2021-05-28 ENCOUNTER — Ambulatory Visit: Payer: BC Managed Care – PPO | Admitting: Physician Assistant

## 2021-05-28 ENCOUNTER — Other Ambulatory Visit: Payer: Self-pay

## 2021-05-28 VITALS — BP 118/82 | HR 54 | Ht 70.0 in | Wt 182.0 lb

## 2021-05-28 DIAGNOSIS — I480 Paroxysmal atrial fibrillation: Secondary | ICD-10-CM | POA: Diagnosis not present

## 2021-05-28 DIAGNOSIS — E785 Hyperlipidemia, unspecified: Secondary | ICD-10-CM

## 2021-05-28 DIAGNOSIS — Z79899 Other long term (current) drug therapy: Secondary | ICD-10-CM

## 2021-05-28 LAB — HEPATIC FUNCTION PANEL
ALT: 34 IU/L (ref 0–44)
AST: 32 IU/L (ref 0–40)
Albumin: 4.7 g/dL (ref 4.0–5.0)
Alkaline Phosphatase: 57 IU/L (ref 44–121)
Bilirubin Total: 0.4 mg/dL (ref 0.0–1.2)
Bilirubin, Direct: <0.1 mg/dL (ref 0.00–0.40)
Total Protein: 6.9 g/dL (ref 6.0–8.5)

## 2021-05-28 LAB — LIPID PANEL
Chol/HDL Ratio: 4.2 ratio (ref 0.0–5.0)
Cholesterol, Total: 189 mg/dL (ref 100–199)
HDL: 45 mg/dL (ref >39)
LDL Chol Calc (NIH): 120 mg/dL — ABNORMAL HIGH (ref 0–99)
Triglycerides: 133 mg/dL (ref 0–149)
VLDL Cholesterol Cal: 24 mg/dL (ref 5–40)

## 2021-05-28 NOTE — Patient Instructions (Signed)
Medication Instructions:  Your physician recommends that you continue on your current medications as directed. Please refer to the Current Medication list given to you today.   *If you need a refill on your cardiac medications before your next appointment, please call your pharmacy*   Lab Work: Your physician recommends that you complete labs today Lipid HFP  If you have labs (blood work) drawn today and your tests are completely normal, you will receive your results only by: [place] (if you have MyChart) OR A paper copy in the mail If you have any lab test that is abnormal or we need to change your treatment, we will call you to review the results.   Testing/Procedures: NONE ordered at this time of appointment     Follow-Up: At [facility], you and your health needs are our priority.  As part of our continuing mission to provide you with exceptional heart care, we have created designated Provider Care Teams.  These Care Teams include your primary Cardiologist (physician) and Advanced Practice Providers (APPs -  Physician Assistants and Nurse Practitioners) who all work together to provide you with the care you need, when you need it.  We recommend signing up for the patient portal called "MyChart".  Sign up information is provided on this After Visit Summary.  MyChart is used to connect with patients for Virtual Visits (Telemedicine).  Patients are able to view lab/test results, encounter notes, upcoming appointments, etc.  Non-urgent messages can be sent to your provider as well.   To learn more about what you can do with MyChart, go to [email or web address].    Your next appointment:   1 year(s)  The format for your next appointment:   In Person  Provider:   Kirk Ruths, MD     Other Instructions

## 2021-05-28 NOTE — Progress Notes (Signed)
Cardiology Office Note:    Date:  05/29/2021   ID:  George Cordova, DOB [birth year], MRN [id number]  PCP:  Donnajean Lopes, MD   [facility] Providers Cardiologist:  Kirk Ruths, MD     Referring MD: Donnajean Lopes, MD   Chief Complaint  Patient presents with   Follow-up    Seen for Dr. Stanford Breed    History of Present Illness:    George Cordova is a 51 y.o. male with a hx of atrial fibrillation and tobacco abuse.  Patient was first diagnosed with atrial fibrillation in January 2016 after presenting to the emergency room.  He converted to sinus rhythm with pill in the pocket flecainide.  Echocardiogram in January 2016 showed normal EF.  TSH, renal function and hemoglobin were normal.  He was last seen by Coletta Memos, NP on 02/29/2020 for follow-up at which time he noted intermittent episode of palpitation however they were brief and only lasted a few seconds.  73-month follow-up was recommended.  Patient presents today for follow-up.  He denies any prolonged episode of palpitation, however does have transient episodes of palpitation.  Overall, she has not had any sign of recurrent A-fib on the current dose of metoprolol.  He is due for fasting lipid panel and LFT.  He denies any recent chest pain or worsening dyspnea.  He can follow-up in 1 year.  Past Medical History:  Diagnosis Date   Asthma    Atrial fibrillation ([place])    Hyperlipidemia     Past Surgical History:  Procedure Laterality Date   ANKLE SURGERY      Current Medications: Current Meds  Medication Sig   Ascorbic Acid (VITAMIN C PO) Take 1 tablet by mouth daily.   aspirin EC 81 MG EC tablet Take 1 tablet (81 mg total) by mouth daily.   atorvastatin (LIPITOR) 40 MG tablet Take 1 tablet by mouth once daily   Cholecalciferol (VITAMIN D PO) Take 1 tablet by mouth daily.   Fish Oil-Cholecalciferol (FISH OIL + D3 PO) Take 1 capsule by mouth daily.   metoprolol succinate (TOPROL-XL) 25 MG 24 hr tablet  Take 1 tablet (25 mg total) by mouth daily.   VITAMIN D, ERGOCALCIFEROL, PO Take 1 capsule by mouth daily.     Allergies:   Patient has no known allergies.   Social History   Socioeconomic History   Marital status: Married    Spouse name: Not on file   Number of children: 2   Years of education: Not on file   Highest education level: Not on file  Occupational History    Comment: Teacher  Tobacco Use   Smoking status: Every Day    Types: Cigarettes   Smokeless tobacco: Never  Substance and Sexual Activity   Alcohol use: No    Alcohol/week: 0.0 standard drinks   Drug use: No   Sexual activity: Not on file  Other Topics Concern   Not on file  Social History Narrative   Not on file   Social Determinants of Health   Financial Resource Strain: Not on file  Food Insecurity: Not on file  Transportation Needs: Not on file  Physical Activity: Not on file  Stress: Not on file  Social Connections: Not on file     Family History: The patient's family history includes Heart disease in an other family member.  ROS:   Please see the history of present illness.     All other systems reviewed and are  negative.  EKGs/Labs/Other Studies Reviewed:    The following studies were reviewed today:  Echo 04/22/2014 LV EF: 50% -   55%   Study Conclusions   - Left ventricle: The cavity size was normal. Wall thickness was    normal. Systolic function was normal. The estimated ejection    fraction was in the range of 50% to 55%. Wall motion was normal;    there were no regional wall motion abnormalities. Left    ventricular diastolic function parameters were normal.   Impressions:   - Normal study.    EKG:  EKG is ordered today.  The ekg ordered today demonstrates normal sinus rhythm, no significant ST-T wave changes.  Heart rate 54 bpm.  Recent Labs: 05/28/2021: ALT 34  Recent Lipid Panel    Component Value Date/Time   CHOL 189 05/28/2021 0958   TRIG 133 05/28/2021 0958    HDL 45 05/28/2021 0958   CHOLHDL 4.2 05/28/2021 0958   LDLCALC 120 (H) 05/28/2021 0958     Risk Assessment/Calculations:    CHA2DS2-VASc Score = 0   This indicates a 0.2% annual risk of stroke. The patient's score is based upon: CHF History: 0 HTN History: 0 Diabetes History: 0 Stroke History: 0 Vascular Disease History: 0 Age Score: 0 Gender Score: 0           Physical Exam:    VS:  BP 118/82    Pulse (!) 54    Ht 5\' 10"  (1.778 m)    Wt 182 lb (82.6 kg)    SpO2 97%    BMI 26.11 kg/m     Wt Readings from Last 3 Encounters:  05/28/21 182 lb (82.6 kg)  02/29/20 171 lb 6.4 oz (77.7 kg)  12/08/18 167 lb (75.8 kg)     GEN:  Well nourished, well developed in no acute distress HEENT: Normal NECK: No JVD; No carotid bruits LYMPHATICS: No lymphadenopathy CARDIAC: RRR, no murmurs, rubs, gallops RESPIRATORY:  Clear to auscultation without rales, wheezing or rhonchi  ABDOMEN: Soft, non-tender, non-distended MUSCULOSKELETAL:  No edema; No deformity  SKIN: Warm and dry NEUROLOGIC:  Alert and oriented x 3 PSYCHIATRIC:  Normal affect   ASSESSMENT:    1. PAF (paroxysmal atrial fibrillation) ([place])   2. Medication management   3. Hyperlipidemia, unspecified hyperlipidemia type    PLAN:    In order of problems listed above:  PAF: No recurrence of atrial fibrillation since 2016.  Not on anticoagulation therapy due to lack of recurrence and low CHA2DS2-VASc score.  Patient is maintained on metoprolol succinate 25 mg daily.  He has occasional brief palpitation however nothing prolonged.  Medication management: Obtain fasting lipid panel and a liver function test  Hyperlipidemia: LDL goal less than 100, obtain fasting lipid panel and LFT.  Continue Lipitor 40 mg daily.           Medication Adjustments/Labs and Tests Ordered: Current medicines are reviewed at length with the patient today.  Concerns regarding medicines are outlined above.  Orders Placed This Encounter   Procedures   Lipid panel   Hepatic function panel   EKG 12-Lead   No orders of the defined types were placed in this encounter.   Patient Instructions  Medication Instructions:  Your physician recommends that you continue on your current medications as directed. Please refer to the Current Medication list given to you today.   *If you need a refill on your cardiac medications before your next appointment, please call your pharmacy*  Lab Work: Your physician recommends that you complete labs today Lipid HFP  If you have labs (blood work) drawn today and your tests are completely normal, you will receive your results only by: MyChart Message (if you have MyChart) OR A paper copy in the mail If you have any lab test that is abnormal or we need to change your treatment, we will call you to review the results.   Testing/Procedures: NONE ordered at this time of appointment     Follow-Up: At [facility], you and your health needs are our priority.  As part of our continuing mission to provide you with exceptional heart care, we have created designated Provider Care Teams.  These Care Teams include your primary Cardiologist (physician) and Advanced Practice Providers (APPs -  Physician Assistants and Nurse Practitioners) who all work together to provide you with the care you need, when you need it.  We recommend signing up for the patient portal called "MyChart".  Sign up information is provided on this After Visit Summary.  MyChart is used to connect with patients for Virtual Visits (Telemedicine).  Patients are able to view lab/test results, encounter notes, upcoming appointments, etc.  Non-urgent messages can be sent to your provider as well.   To learn more about what you can do with MyChart, go to [email or web address].    Your next appointment:   1 year(s)  The format for your next appointment:   In Person  Provider:   Kirk Ruths, MD     Other Instructions     Signed, Almyra Deforest, [place]  05/29/2021 1:19 PM    [place]

## 2021-05-29 ENCOUNTER — Encounter: Payer: Self-pay | Admitting: Physician Assistant

## 2021-05-29 NOTE — Progress Notes (Signed)
Normal liver function, bad cholesterol significantly improved from a year ago, but remain very much elevated. LDL goal <100, his LDL 120. Recommend increase lipitor to 80mg  daily. Repeat FLP and LFT in 3 month.

## 2021-06-07 ENCOUNTER — Telehealth: Payer: Self-pay | Admitting: Cardiology

## 2021-06-07 NOTE — Telephone Encounter (Signed)
Patient was returning call for results. Please advise °

## 2021-06-08 ENCOUNTER — Other Ambulatory Visit: Payer: Self-pay

## 2021-06-08 DIAGNOSIS — E7800 Pure hypercholesterolemia, unspecified: Secondary | ICD-10-CM

## 2021-06-08 DIAGNOSIS — E785 Hyperlipidemia, unspecified: Secondary | ICD-10-CM

## 2021-06-08 MED ORDER — ATORVASTATIN CALCIUM 80 MG PO TABS: 80.0000 mg | ORAL_TABLET | Freq: Every day | ORAL | 3 refills | Status: DC

## 2021-06-08 NOTE — Progress Notes (Signed)
Went over lab results with patient and order for fasting labs in 3 months. Also discussed increasing lipitor to 80 mg each day. Prescription sent to patient's pharmacy. ?

## 2021-06-08 NOTE — Telephone Encounter (Signed)
LMTCB

## 2021-06-08 NOTE — Telephone Encounter (Signed)
Went over lab results with patient and order for fasting labs in 3 months. Also discussed increasing lipitor to 80 mg each day. Prescription sent to patient's pharmacy. Lab slip mailed to patient. ?

## 2021-06-18 ENCOUNTER — Telehealth: Payer: Self-pay

## 2021-06-18 NOTE — Telephone Encounter (Addendum)
Left a voice message asking patient to give office a call back for recent blood results and medication change. Will try calling again. ? ?----- Message from Azalee Course, [place] sent at 05/29/2021  1:22 PM EST ----- ?Normal liver function, bad cholesterol significantly improved from a year ago, but remain very much elevated. LDL goal <100, his LDL 120. Recommend increase lipitor to 80mg  daily. Repeat FLP and LFT in 3 month.  ?

## 2021-09-04 ENCOUNTER — Other Ambulatory Visit: Payer: Self-pay | Admitting: Cardiology

## 2021-09-04 DIAGNOSIS — I4891 Unspecified atrial fibrillation: Secondary | ICD-10-CM

## 2021-10-16 ENCOUNTER — Encounter: Payer: Self-pay | Admitting: *Deleted

## 2021-12-02 ENCOUNTER — Other Ambulatory Visit: Payer: Self-pay | Admitting: Cardiology

## 2021-12-02 DIAGNOSIS — I4891 Unspecified atrial fibrillation: Secondary | ICD-10-CM

## 2022-03-27 DIAGNOSIS — M25562 Pain in left knee: Secondary | ICD-10-CM | POA: Insufficient documentation

## 2022-05-23 ENCOUNTER — Other Ambulatory Visit: Payer: Self-pay | Admitting: Internal Medicine

## 2022-05-23 DIAGNOSIS — Z87891 Personal history of nicotine dependence: Secondary | ICD-10-CM

## 2022-06-26 ENCOUNTER — Encounter: Payer: Self-pay | Admitting: Internal Medicine

## 2022-07-02 ENCOUNTER — Other Ambulatory Visit: Payer: BC Managed Care – PPO

## 2022-07-08 ENCOUNTER — Telehealth: Payer: Self-pay | Admitting: Cardiology

## 2022-07-08 MED ORDER — ATORVASTATIN CALCIUM 80 MG PO TABS: 80.0000 mg | ORAL_TABLET | Freq: Every day | ORAL | 0 refills | Status: DC

## 2022-07-08 NOTE — Telephone Encounter (Signed)
*  STAT* If patient is at the pharmacy, call can be transferred to refill team.   1. Which medications need to be refilled? (please list name of each medication and dose if known)  atorvastatin (LIPITOR) 80 MG tablet (Expired)    2. Which pharmacy/location (including street and city if local pharmacy) is medication to be sent to?[place], [place] - [id number]    3. Do they need a 30 day or 90 day supply? [place]

## 2022-07-08 NOTE — Telephone Encounter (Signed)
Rx sent to pharmacy.   Overdue for follow up, advised to call to arrange appt.

## 2022-08-01 ENCOUNTER — Other Ambulatory Visit: Payer: Self-pay | Admitting: Physician Assistant

## 2022-09-04 ENCOUNTER — Telehealth: Payer: Self-pay | Admitting: Cardiology

## 2022-09-04 MED ORDER — ATORVASTATIN CALCIUM 80 MG PO TABS: 80.0000 mg | ORAL_TABLET | Freq: Every day | ORAL | 1 refills | Status: DC

## 2022-09-04 NOTE — Telephone Encounter (Signed)
*  STAT* If patient is at the pharmacy, call can be transferred to refill team.   1. Which medications need to be refilled? (please list name of each medication and dose if known)   atorvastatin (LIPITOR) 80 MG tablet    2. Which pharmacy/location (including street and city if local pharmacy) is medication to be sent to?  [id number] - [place], [place] - [address]    3. Do they need a 30 day or 90 day supply? 90    Pt is scheduled for 03/13/23

## 2022-09-04 NOTE — Telephone Encounter (Signed)
Refills has been sent to the pharmacy. 

## 2022-09-06 ENCOUNTER — Ambulatory Visit (AMBULATORY_SURGERY_CENTER): Payer: BC Managed Care – PPO

## 2022-09-06 ENCOUNTER — Other Ambulatory Visit: Payer: Self-pay

## 2022-09-06 VITALS — Ht 70.0 in | Wt 190.0 lb

## 2022-09-06 DIAGNOSIS — Z1211 Encounter for screening for malignant neoplasm of colon: Secondary | ICD-10-CM

## 2022-09-06 MED ORDER — NA SULFATE-K SULFATE-MG SULF 17.5-3.13-1.6 GM/177ML PO SOLN
1.0000 | Freq: Once | ORAL | 0 refills | Status: AC
Start: 2022-09-06 — End: 2022-09-06

## 2022-09-06 NOTE — Progress Notes (Signed)
Denies allergies to eggs or soy products. Denies complication of anesthesia or sedation. Denies use of weight loss medication. Denies use of O2.   Emmi instructions given for colonoscopy.  

## 2022-09-12 ENCOUNTER — Encounter: Payer: Self-pay | Admitting: Internal Medicine

## 2022-09-25 ENCOUNTER — Ambulatory Visit (AMBULATORY_SURGERY_CENTER): Payer: BC Managed Care – PPO | Admitting: Internal Medicine

## 2022-09-25 ENCOUNTER — Encounter: Payer: Self-pay | Admitting: Internal Medicine

## 2022-09-25 VITALS — BP 107/64 | HR 52 | Temp 97.1°F | Resp 21 | Ht 70.0 in | Wt 190.0 lb

## 2022-09-25 DIAGNOSIS — D123 Benign neoplasm of transverse colon: Secondary | ICD-10-CM

## 2022-09-25 DIAGNOSIS — D122 Benign neoplasm of ascending colon: Secondary | ICD-10-CM

## 2022-09-25 DIAGNOSIS — Z1211 Encounter for screening for malignant neoplasm of colon: Secondary | ICD-10-CM | POA: Diagnosis present

## 2022-09-25 MED ORDER — SODIUM CHLORIDE 0.9 % IV SOLN
500.0000 mL | Freq: Once | INTRAVENOUS | Status: DC
Start: 2022-09-25 — End: 2022-09-25

## 2022-09-25 NOTE — Patient Instructions (Signed)
Recommendation:- Repeat colonoscopy in 7-10 years for surveillance.                           - Patient has a contact number available for                            emergencies. The signs and symptoms of potential                            delayed complications were discussed with the                            patient. Return to normal activities tomorrow.                            Written discharge instructions were provided to the                            patient.                           - Resume previous diet.                           - Continue present medications.                           - Await pathology results.  Handouts on polyps and diverticulosis given.  YOU HAD AN ENDOSCOPIC PROCEDURE TODAY AT THE [facility]:   Refer to the procedure report that was given to you for any specific questions about what was found during the examination.  If the procedure report does not answer your questions, please call your gastroenterologist to clarify.  If you requested that your care partner not be given the details of your procedure findings, then the procedure report has been included in a sealed envelope for you to review at your convenience later.  YOU SHOULD EXPECT: Some feelings of bloating in the abdomen. Passage of more gas than usual.  Walking can help get rid of the air that was put into your GI tract during the procedure and reduce the bloating. If you had a lower endoscopy (such as a colonoscopy or flexible sigmoidoscopy) you may notice spotting of blood in your stool or on the toilet paper. If you underwent a bowel prep for your procedure, you may not have a normal bowel movement for a few days.  Please Note:  You might notice some irritation and congestion in your nose or some drainage.  This is from the oxygen used during your procedure.  There is no need for concern and it should clear up in a day or so.  SYMPTOMS TO REPORT IMMEDIATELY:  Following lower  endoscopy (colonoscopy or flexible sigmoidoscopy):  Excessive amounts of blood in the stool  Significant tenderness or worsening of abdominal pains  Swelling of the abdomen that is new, acute  Fever of 100F or higher  For urgent or emergent issues, a gastroenterologist can be reached at any hour by calling [phone]. Do not use MyChart messaging for urgent concerns.    DIET:  We do recommend a   small meal at first, but then you may proceed to your regular diet.  Drink plenty of fluids but you should avoid alcoholic beverages for 24 hours.  ACTIVITY:  You should plan to take it easy for the rest of today and you should NOT DRIVE or use heavy machinery until tomorrow (because of the sedation medicines used during the test).    FOLLOW UP: Our staff will call the number listed on your records the next business day following your procedure.  We will call around 7:15- 8:00 am to check on you and address any questions or concerns that you may have regarding the information given to you following your procedure. If we do not reach you, we will leave a message.     If any biopsies were taken you will be contacted by phone or by letter within the next 1-3 weeks.  Please call us at [phone] if you have not heard about the biopsies in 3 weeks.    SIGNATURES/CONFIDENTIALITY: You and/or your care partner have signed paperwork which will be entered into your electronic medical record.  These signatures attest to the fact that that the information above on your After Visit Summary has been reviewed and is understood.  Full responsibility of the confidentiality of this discharge information lies with you and/or your care-partner. 

## 2022-09-25 NOTE — Op Note (Signed)
[facility] Patient Name: George Cordova Procedure Date: 09/25/2022 11:22 AM MRN: [id number] Endoscopist: Wilhemina Bonito. Marina Goodell , MD, [id number] Age: 52 Referring MD:  Date of Birth: [birth year] Gender: Male Account #: [id number] Procedure:                Colonoscopy with cold snare polypectomy x 2 Indications:              Screening for colorectal malignant neoplasm Medicines:                Monitored Anesthesia Care Procedure:                Pre-Anesthesia Assessment:                           - Prior to the procedure, a History and Physical                            was performed, and patient medications and                            allergies were reviewed. The patient's tolerance of                            previous anesthesia was also reviewed. The risks                            and benefits of the procedure and the sedation                            options and risks were discussed with the patient.                            All questions were answered, and informed consent                            was obtained. Prior Anticoagulants: The patient has                            taken no anticoagulant or antiplatelet agents. ASA                            Grade Assessment: II - A patient with mild systemic                            disease. After reviewing the risks and benefits,                            the patient was deemed in satisfactory condition to                            undergo the procedure.                           After obtaining informed consent, the colonoscope  was passed under direct vision. Throughout the                            procedure, the patient's blood pressure, pulse, and                            oxygen saturations were monitored continuously. The                            Olympus SN [id number] was introduced through the anus                            and advanced to the the cecum, identified by                             appendiceal orifice and ileocecal valve. The                            ileocecal valve, appendiceal orifice, and rectum                            were photographed. The quality of the bowel                            preparation was good. The colonoscopy was performed                            without difficulty. The patient tolerated the                            procedure well. The bowel preparation used was                            SUPREP via split dose instruction. Scope In: 11:27:56 AM Scope Out: 11:39:04 AM Scope Withdrawal Time: 0 hours 9 minutes 57 seconds  Total Procedure Duration: 0 hours 11 minutes 8 seconds  Findings:                 Two polyps were found in the transverse colon and                            ascending colon. The polyps were 1 to 3 mm in size.                            These polyps were removed with a cold snare.                            Resection and retrieval were complete.                           A few small-mouthed diverticula were found in the                            left colon  and right colon.                           The exam was otherwise without abnormality on                            direct and retroflexion views. Complications:            No immediate complications. Estimated blood loss:                            None. Estimated Blood Loss:     Estimated blood loss: none. Impression:               - Two 1 to 3 mm polyps in the transverse colon and                            in the ascending colon, removed with a cold snare.                            Resected and retrieved.                           - Diverticulosis in the left colon and in the right                            colon.                           - The examination was otherwise normal on direct                            and retroflexion views. Recommendation:           - Repeat colonoscopy in 7-10 years for surveillance.                           -  Patient has a contact number available for                            emergencies. The signs and symptoms of potential                            delayed complications were discussed with the                            patient. Return to normal activities tomorrow.                            Written discharge instructions were provided to the                            patient.                           - Resume previous diet.                           -  Continue present medications.                           - Await pathology results. Wilhemina Bonito. Marina Goodell, MD 09/25/2022 11:46:17 AM This report has been signed electronically.

## 2022-09-25 NOTE — Progress Notes (Signed)
HISTORY OF PRESENT ILLNESS:  George Cordova is a 53 y.o. male who presents today for colon cancer screening  REVIEW OF SYSTEMS:  All non-GI ROS negative except for  Past Medical History:  Diagnosis Date   Asthma    Atrial fibrillation ([facility])    Hyperlipidemia     Past Surgical History:  Procedure Laterality Date   ANKLE SURGERY      Social History George Cordova  reports that he has quit smoking. His smoking use included cigarettes. He has never used smokeless tobacco. He reports current alcohol use. He reports that he does not use drugs.  family history includes Heart disease in an other family member.  No Known Allergies     PHYSICAL EXAMINATION: Vital signs: BP 130/74   Pulse (!) 52   Temp (!) 97.1 F (36.2 C) (Skin)   Ht 5\' 10"  (1.778 m)   Wt 190 lb (86.2 kg)   SpO2 98%   BMI 27.26 kg/m  General: Well-developed, well-nourished, no acute distress HEENT: Sclerae are anicteric, conjunctiva pink. Oral mucosa intact Lungs: Clear Heart: Regular Abdomen: soft, nontender, nondistended, no obvious ascites, no peritoneal signs, normal bowel sounds. No organomegaly. Extremities: No edema Psychiatric: alert and oriented x3. Cooperative     ASSESSMENT:  Colon cancer screening   PLAN:   Screening colonoscopy

## 2022-09-25 NOTE — Progress Notes (Signed)
Vss nad trans to pacu 

## 2022-09-25 NOTE — Progress Notes (Signed)
Pt's states no medical or surgical changes since previsit or office visit. 

## 2022-09-25 NOTE — Progress Notes (Signed)
Called to room to assist during endoscopic procedure.  Patient ID and intended procedure confirmed with present staff. Received instructions for my participation in the procedure from the performing physician.  

## 2022-09-26 ENCOUNTER — Telehealth: Payer: Self-pay | Admitting: *Deleted

## 2022-09-26 NOTE — Telephone Encounter (Signed)
  Follow up Call-     09/25/2022   10:50 AM  Call back number  Post procedure Call Back phone  # [phone]  Permission to leave phone message Yes     Patient questions:  Do you have a fever, pain , or abdominal swelling? No. Pain Score  0 *  Have you tolerated food without any problems? Yes.    Have you been able to return to your normal activities? Yes.    Do you have any questions about your discharge instructions: Diet   No. Medications  No. Follow up visit  No.  Do you have questions or concerns about your Care? No.  Actions: * If pain score is 4 or above: No action needed, pain <4.

## 2022-09-28 ENCOUNTER — Encounter: Payer: Self-pay | Admitting: Internal Medicine

## 2022-12-03 ENCOUNTER — Encounter: Payer: Self-pay | Admitting: Cardiology

## 2022-12-31 ENCOUNTER — Telehealth: Payer: Self-pay | Admitting: Cardiology

## 2022-12-31 DIAGNOSIS — I4891 Unspecified atrial fibrillation: Secondary | ICD-10-CM

## 2022-12-31 NOTE — Telephone Encounter (Signed)
*  STAT* If patient is at the pharmacy, call can be transferred to refill team.   1. Which medications need to be refilled? (please list name of each medication and dose if known)  metoprolol succinate (TOPROL-XL) 25 MG 24 hr tablet    2. Would you like to learn more about the convenience, safety, & potential cost savings by using the [facility]?    3. Are you open to using the [facility] (Type [facility].  ).   4. Which pharmacy/location (including street and city if local pharmacy) is medication to be sent to? [facility] [address] [place] - [address]    5. Do they need a 30 day or 90 day supply? 90 day  Patient is out of medication

## 2023-01-01 MED ORDER — METOPROLOL SUCCINATE ER 25 MG PO TB24
25.0000 mg | ORAL_TABLET | Freq: Every day | ORAL | 1 refills | Status: DC
Start: 2023-01-01 — End: 2023-03-03

## 2023-01-01 NOTE — Telephone Encounter (Signed)
Pt's medication was sent to pt's pharmacy as requested. Confirmation received.  °

## 2023-01-20 NOTE — Progress Notes (Signed)
     HPI: FU atrial fibrillation. Seen in ER 1/16 with atrial fibrillation. Patient converted to sinus with flecanide 300 mg po x 1. Echo 1/16 showed normal LV function. TSH, renal function and hemoglobin normal. Since last seen, occasional brief flutter but no sustained palpitations consistent with atrial fibrillation.  There is no dyspnea on exertion, orthopnea, PND, pedal edema or chest pain.  Current Outpatient Medications  Medication Sig Dispense Refill   aspirin EC 81 MG EC tablet Take 1 tablet (81 mg total) by mouth daily.     atorvastatin (LIPITOR) 80 MG tablet Take 1 tablet (80 mg total) by mouth daily. KEEP OV. 90 tablet 1   metoprolol succinate (TOPROL-XL) 25 MG 24 hr tablet Take 1 tablet (25 mg total) by mouth daily. 30 tablet 1   No current facility-administered medications for this visit.     Past Medical History:  Diagnosis Date   Asthma    Atrial fibrillation (HCC)    Hyperlipidemia     Past Surgical History:  Procedure Laterality Date   ANKLE SURGERY      Social History   Socioeconomic History   Marital status: Married    Spouse name: Not on file   Number of children: 2   Years of education: Not on file   Highest education level: Not on file  Occupational History    Comment: Teacher  Tobacco Use   Smoking status: Former    Types: Cigarettes   Smokeless tobacco: Never  Vaping Use   Vaping status: Never Used  Substance and Sexual Activity   Alcohol use: Yes    Comment: Rare   Drug use: No   Sexual activity: Not on file  Other Topics Concern   Not on file  Social History Narrative   Not on file   Social Determinants of Health   Financial Resource Strain: Not on file  Food Insecurity: Not on file  Transportation Needs: Not on file  Physical Activity: Not on file  Stress: Not on file  Social Connections: Not on file  Intimate Partner Violence: Not on file    Family History  Problem Relation Age of Onset   Heart disease Other        No  family history   Colon cancer Neg Hx    Esophageal cancer Neg Hx    Rectal cancer Neg Hx    Stomach cancer Neg Hx     ROS: no fevers or chills, productive cough, hemoptysis, dysphasia, odynophagia, melena, hematochezia, dysuria, hematuria, rash, seizure activity, orthopnea, PND, pedal edema, claudication. Remaining systems are negative.  Physical Exam: Well-developed well-nourished in no acute distress.  Skin is warm and dry.  HEENT is normal.  Neck is supple.  Chest is clear to auscultation with normal expansion.  Cardiovascular exam is regular rate and rhythm.  Abdominal exam nontender or distended. No masses palpated. Extremities show no edema. neuro grossly intact  ECG- personally reviewed  A/P  1 paroxysmal atrial fibrillation-patient is in sinus rhythm today.  Will continue Toprol for rate control if atrial fibrillation recurs.  He is not on anticoagulation as his CHA2DS2-VASc is 0.  Will consider referral for ablation versus antiarrhythmic therapy in the future if he has more frequent episodes.  2 hyperlipidemia-continue statin.  3 tobacco abuse-patient discontinued previously 2 years ago.  Olga Millers, MD

## 2023-02-03 ENCOUNTER — Encounter: Payer: Self-pay | Admitting: Cardiology

## 2023-02-03 ENCOUNTER — Ambulatory Visit: Payer: BC Managed Care – PPO | Attending: Cardiology | Admitting: Cardiology

## 2023-02-03 VITALS — BP 120/80 | HR 57 | Ht 70.0 in | Wt 191.4 lb

## 2023-02-03 DIAGNOSIS — Z720 Tobacco use: Secondary | ICD-10-CM

## 2023-02-03 DIAGNOSIS — E785 Hyperlipidemia, unspecified: Secondary | ICD-10-CM | POA: Diagnosis not present

## 2023-02-03 DIAGNOSIS — I4891 Unspecified atrial fibrillation: Secondary | ICD-10-CM | POA: Diagnosis not present

## 2023-02-03 MED ORDER — ATORVASTATIN CALCIUM 80 MG PO TABS: 80.0000 mg | ORAL_TABLET | Freq: Every day | ORAL | 3 refills | Status: DC

## 2023-02-03 NOTE — Patient Instructions (Signed)
Medication Instructions:  Lipitor refill script sent. *If you need a refill on your cardiac medications before your next appointment, please call your pharmacy*     Follow-Up: At [facility], you and your health needs are our priority.  As part of our continuing mission to provide you with exceptional heart care, we have created designated Provider Care Teams.  These Care Teams include your primary Cardiologist (physician) and Advanced Practice Providers (APPs -  Physician Assistants and Nurse Practitioners) who all work together to provide you with the care you need, when you need it.  We recommend signing up for the patient portal called "MyChart".  Sign up information is provided on this After Visit Summary.  MyChart is used to connect with patients for Virtual Visits (Telemedicine).  Patients are able to view lab/test results, encounter notes, upcoming appointments, etc.  Non-urgent messages can be sent to your provider as well.   To learn more about what you can do with MyChart, go to [email or web address].    Your next appointment:   12 month(s)  Provider:   Olga Millers, MD

## 2023-03-02 ENCOUNTER — Other Ambulatory Visit: Payer: Self-pay | Admitting: Cardiology

## 2023-03-02 DIAGNOSIS — I4891 Unspecified atrial fibrillation: Secondary | ICD-10-CM

## 2023-03-13 ENCOUNTER — Ambulatory Visit: Payer: BC Managed Care – PPO | Admitting: Cardiology

## 2024-04-29 ENCOUNTER — Telehealth: Payer: Self-pay | Admitting: Cardiology

## 2024-04-29 NOTE — Telephone Encounter (Signed)
" °*  STAT* If patient is at the pharmacy, call can be transferred to refill team.   1. Which medications need to be refilled? (please list name of each medication and dose if known)   atorvastatin  (LIPITOR) 80 MG tablet (   metoprolol  succinate (TOPROL -XL) 25 MG 24 hr tablet   2. Would you like to learn more about the convenience, safety, & potential cost savings by using the [facility]?    3. Are you open to using the [facility] (Type [facility].    4. Which pharmacy/location (including street and city if local pharmacy) is medication to be sent to? [facility] [address] [place] - [address]       5. Do they need a 30 day or 90 day supply? 90  Patient is out of meds   "

## 2024-04-30 ENCOUNTER — Other Ambulatory Visit: Payer: Self-pay | Admitting: Cardiology

## 2024-04-30 DIAGNOSIS — I4891 Unspecified atrial fibrillation: Secondary | ICD-10-CM

## 2024-04-30 MED ORDER — ATORVASTATIN CALCIUM 80 MG PO TABS: 80.0000 mg | ORAL_TABLET | Freq: Every day | ORAL | 0 refills | Status: AC

## 2024-04-30 NOTE — Telephone Encounter (Signed)
 Pt's medication was sent to pt's pharmacy as requested. Confirmation received.

## 2024-08-03 ENCOUNTER — Ambulatory Visit: Payer: Self-pay | Admitting: Cardiology
